# Patient Record
Sex: Female | Born: 1985 | Race: White | Hispanic: Yes | Marital: Married | Smoking: Never smoker
Health system: Southern US, Community
[De-identification: ages and names within clinical notes are randomized; demographics above are authoritative.]

## PROBLEM LIST (undated history)

## (undated) ENCOUNTER — Inpatient Hospital Stay (HOSPITAL_COMMUNITY): Payer: Self-pay

## (undated) DIAGNOSIS — Z789 Other specified health status: Secondary | ICD-10-CM

## (undated) DIAGNOSIS — T7840XA Allergy, unspecified, initial encounter: Secondary | ICD-10-CM

## (undated) HISTORY — DX: Allergy, unspecified, initial encounter: T78.40XA

---
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
## 2009-01-07 ENCOUNTER — Inpatient Hospital Stay (HOSPITAL_COMMUNITY): Admission: AD | Admit: 2009-01-07 | Discharge: 2009-01-09 | Payer: Self-pay | Admitting: Obstetrics and Gynecology

## 2009-02-17 ENCOUNTER — Emergency Department (HOSPITAL_COMMUNITY): Admission: EM | Admit: 2009-02-17 | Discharge: 2009-02-17 | Payer: Self-pay | Admitting: Emergency Medicine

## 2010-04-17 LAB — RPR: RPR Ser Ql: NONREACTIVE

## 2010-04-17 LAB — CBC
HCT: 39.6 % (ref 36.0–46.0)
MCHC: 33.8 g/dL (ref 30.0–36.0)
MCHC: 34 g/dL (ref 30.0–36.0)
MCV: 99.2 fL (ref 78.0–100.0)
Platelets: 281 10*3/uL (ref 150–400)
Platelets: 294 10*3/uL (ref 150–400)
RDW: 12.8 % (ref 11.5–15.5)
RDW: 12.9 % (ref 11.5–15.5)
WBC: 10.7 10*3/uL — ABNORMAL HIGH (ref 4.0–10.5)

## 2011-08-22 ENCOUNTER — Ambulatory Visit (INDEPENDENT_AMBULATORY_CARE_PROVIDER_SITE_OTHER): Payer: PRIVATE HEALTH INSURANCE | Admitting: Family Medicine

## 2011-08-22 VITALS — BP 102/70 | HR 79 | Temp 98.8°F | Resp 16 | Ht 66.25 in | Wt 131.0 lb

## 2011-08-22 DIAGNOSIS — Z0000 Encounter for general adult medical examination without abnormal findings: Secondary | ICD-10-CM

## 2011-08-22 DIAGNOSIS — J309 Allergic rhinitis, unspecified: Secondary | ICD-10-CM

## 2011-08-22 DIAGNOSIS — IMO0001 Reserved for inherently not codable concepts without codable children: Secondary | ICD-10-CM

## 2011-08-22 DIAGNOSIS — Z01419 Encounter for gynecological examination (general) (routine) without abnormal findings: Secondary | ICD-10-CM

## 2011-08-22 DIAGNOSIS — Z309 Encounter for contraceptive management, unspecified: Secondary | ICD-10-CM

## 2011-08-22 LAB — POCT CBC
Granulocyte percent: 68.9 % (ref 37–80)
HCT, POC: 45.8 % (ref 37.7–47.9)
Hemoglobin: 14.5 g/dL (ref 12.2–16.2)
Lymph, poc: 1.9 (ref 0.6–3.4)
MCH, POC: 30.8 pg (ref 27–31.2)
MCHC: 31.7 g/dL — AB (ref 31.8–35.4)
MCV: 97.3 fL — AB (ref 80–97)
MID (cbc): 0.4 (ref 0–0.9)
MPV: 9.1 fL (ref 0–99.8)
POC Granulocyte: 5.2 (ref 2–6.9)
POC LYMPH PERCENT: 25.8 %L (ref 10–50)
POC MID %: 5.3 % (ref 0–12)
Platelet Count, POC: 314 10*3/uL (ref 142–424)
RBC: 4.71 M/uL (ref 4.04–5.48)
RDW, POC: 12.9 %
WBC: 7.5 10*3/uL (ref 4.6–10.2)

## 2011-08-22 MED ORDER — NORGESTIMATE-ETH ESTRADIOL 0.25-35 MG-MCG PO TABS: 1.0000 | ORAL_TABLET | Freq: Every day | ORAL | Status: DC

## 2011-08-22 MED ORDER — FLUTICASONE PROPIONATE 50 MCG/ACT NA SUSP: 2.0000 | Freq: Every day | NASAL | Status: DC

## 2011-08-22 NOTE — Progress Notes (Signed)
[facility]:  Office Visit  Chief Complaint:  Chief Complaint  Patient presents with  . Nasal Congestion    x 1 week  . Sinusitis  . Sore Throat  . pap smear  . Contraception    HPI: Jade Walters is a 26 y.o. female who complains of : 1. 1 week h/o of sinus sxs with congestion, denies fevers, chills. Had achey bones now gone. Did not try anything for it. In past she had a nasal spray and that helped. She has a h/o allergies. 2. Annual exam-would like pap. Last pap Dec 2010. Would still like one.  3. Breast bump- Patient states that she got this bump on areola during breastfeeding, picked at it and it never went down-Deneis change in size, color or darainage since she noticed it. Mother's sister had breast cancer in her 30s.   Past Medical History  Diagnosis Date  . Allergy    History reviewed. No pertinent past surgical history. History   Social History  . Marital Status: Single    Spouse Name: N/A    Number of Children: N/A  . Years of Education: N/A   Social History Main Topics  . Smoking status: Never Smoker   . Smokeless tobacco: None  . Alcohol Use: No  . Drug Use: No  . Sexually Active: None   Other Topics Concern  . None   Social History Narrative  . None   Family History  Problem Relation Age of Onset  . Hypertension Father   . Hyperlipidemia Father    No Known Allergies Prior to Admission medications   Not on File     ROS: The patient denies fevers, chills, night sweats, unintentional weight loss, chest pain, palpitations, wheezing, dyspnea on exertion, nausea, vomiting, abdominal pain, dysuria, hematuria, melena, numbness, weakness, or tingling.   All other systems have been reviewed and were otherwise negative with the exception of those mentioned in the HPI and as above.    PHYSICAL EXAM: Filed Vitals:   08/22/11 1659  BP: 102/70  Pulse: 79  Temp: 98.8 F (37.1 C)  Resp: 16   Filed Vitals:   08/22/11 1659    Height: 5' 6.25" (1.683 m)  Weight: 131 lb (59.421 kg)   Body mass index is 20.98 kg/(m^2).  General: Alert, no acute distress HEENT:  Normocephalic, atraumatic, oropharynx patent.  Cardiovascular:  Regular rate and rhythm, no rubs murmurs or gallops.  No Carotid bruits, radial pulse intact. No pedal edema.  Respiratory: Clear to auscultation bilaterally.  No wheezes, rales, or rhonchi.  No cyanosis, no use of accessory musculature GI: No organomegaly, abdomen is soft and non-tender, positive bowel sounds.  No masses. Skin: No rashes. Neurologic: Facial musculature symmetric. Psychiatric: Patient is appropriate throughout our interaction. Lymphatic: No cervical lymphadenopathy Musculoskeletal: Gait intact. Breast - normal except for ? 3 mm nontender papule, moevable.  Encapsulated fibrosis of montgomery mammary gland on upper lateral left areola  GU exam nl  LABS: Results for orders placed during the hospital encounter of 02/17/09  POCT RAPID STREP A      Component Value Range   Streptococcus, Group A Screen (Direct) NEGATIVE  NEGATIVE     EKG/XRAY:   Primary read interpreted by Dr. Conley Rolls at [facility]   ASSESSMENT/PLAN: Encounter Diagnoses  Name Primary?  . Physical exam, annual Yes  . Birth control   . Allergic rhinitis    Patient doing well.  CBC, CMP ,LDL pending Monitor nodular lesion on areola  left breast, if not better than will get US/Mammogram/reexamine. I think this is a mammary gland that healed and scarred. Warm compresses. Stop picking at it. F/u in 4 weeks if no improvement Flonase for allergic rhinitis Sprintec given for OCP     LE, THAO PHUONG, DO 08/22/2011 5:49 PM

## 2011-08-23 LAB — LDL CHOLESTEROL, DIRECT: Direct LDL: 130 mg/dL — ABNORMAL HIGH

## 2011-08-23 LAB — BASIC METABOLIC PANEL WITH GFR
Chloride: 102 meq/L (ref 96–112)
Creat: 0.64 mg/dL (ref 0.50–1.10)
Potassium: 4.1 meq/L (ref 3.5–5.3)

## 2011-08-23 LAB — BASIC METABOLIC PANEL
BUN: 18 mg/dL (ref 6–23)
CO2: 24 mEq/L (ref 19–32)
Calcium: 9.5 mg/dL (ref 8.4–10.5)
Glucose, Bld: 76 mg/dL (ref 70–99)
Sodium: 137 mEq/L (ref 135–145)

## 2011-08-24 LAB — PAP IG, CT-NG, RFX HPV ASCU
Chlamydia Probe Amp: NEGATIVE
GC Probe Amp: NEGATIVE

## 2011-08-29 ENCOUNTER — Telehealth: Payer: Self-pay | Admitting: Family Medicine

## 2011-08-29 NOTE — Telephone Encounter (Signed)
Attempted to LM but voicemail not activated

## 2011-09-18 ENCOUNTER — Encounter: Payer: Self-pay | Admitting: Family Medicine

## 2014-03-18 ENCOUNTER — Other Ambulatory Visit: Payer: Self-pay | Admitting: Nurse Practitioner

## 2014-03-18 ENCOUNTER — Other Ambulatory Visit (HOSPITAL_COMMUNITY)
Admission: RE | Admit: 2014-03-18 | Discharge: 2014-03-18 | Disposition: A | Discharge status: Home / Self Care | Payer: 59 | Source: Ambulatory Visit | Attending: Obstetrics and Gynecology | Admitting: Obstetrics and Gynecology

## 2014-03-18 DIAGNOSIS — Z01419 Encounter for gynecological examination (general) (routine) without abnormal findings: Secondary | ICD-10-CM | POA: Diagnosis present

## 2014-03-19 LAB — CYTOLOGY - PAP

## 2016-07-26 ENCOUNTER — Encounter (HOSPITAL_COMMUNITY): Payer: Self-pay | Admitting: Emergency Medicine

## 2016-07-26 ENCOUNTER — Emergency Department (HOSPITAL_COMMUNITY): Payer: 59

## 2016-07-26 ENCOUNTER — Emergency Department (HOSPITAL_COMMUNITY)
Admission: EM | Admit: 2016-07-26 | Discharge: 2016-07-26 | Disposition: A | Discharge status: Home / Self Care | Payer: 59 | Attending: Emergency Medicine | Admitting: Emergency Medicine

## 2016-07-26 DIAGNOSIS — F419 Anxiety disorder, unspecified: Secondary | ICD-10-CM | POA: Diagnosis not present

## 2016-07-26 DIAGNOSIS — Z79899 Other long term (current) drug therapy: Secondary | ICD-10-CM | POA: Diagnosis not present

## 2016-07-26 DIAGNOSIS — R202 Paresthesia of skin: Secondary | ICD-10-CM | POA: Insufficient documentation

## 2016-07-26 DIAGNOSIS — R200 Anesthesia of skin: Secondary | ICD-10-CM | POA: Diagnosis not present

## 2016-07-26 LAB — COMPREHENSIVE METABOLIC PANEL
ALBUMIN: 4.5 g/dL (ref 3.5–5.0)
ALT: 16 U/L (ref 14–54)
ANION GAP: 10 (ref 5–15)
AST: 21 U/L (ref 15–41)
Alkaline Phosphatase: 51 U/L (ref 38–126)
BILIRUBIN TOTAL: 0.6 mg/dL (ref 0.3–1.2)
BUN: 16 mg/dL (ref 6–20)
CO2: 25 mmol/L (ref 22–32)
Calcium: 9.2 mg/dL (ref 8.9–10.3)
Chloride: 105 mmol/L (ref 101–111)
Creatinine, Ser: 0.71 mg/dL (ref 0.44–1.00)
GFR calc Af Amer: >60 mL/min (ref >60)
GLUCOSE: 109 mg/dL — AB (ref 65–99)
POTASSIUM: 3.4 mmol/L — AB (ref 3.5–5.1)
Sodium: 140 mmol/L (ref 135–145)
TOTAL PROTEIN: 7.3 g/dL (ref 6.5–8.1)

## 2016-07-26 LAB — I-STAT CHEM 8, ED
BUN: 18 mg/dL (ref 6–20)
Calcium, Ion: 1.17 mmol/L (ref 1.15–1.40)
Chloride: 101 mmol/L (ref 101–111)
Creatinine, Ser: 0.7 mg/dL (ref 0.44–1.00)
Glucose, Bld: 108 mg/dL — ABNORMAL HIGH (ref 65–99)
HEMATOCRIT: 40 % (ref 36.0–46.0)
HEMOGLOBIN: 13.6 g/dL (ref 12.0–15.0)
Potassium: 3.5 mmol/L (ref 3.5–5.1)
SODIUM: 141 mmol/L (ref 135–145)
TCO2: 28 mmol/L (ref 0–100)

## 2016-07-26 LAB — DIFFERENTIAL
Basophils Absolute: 0 10*3/uL (ref 0.0–0.1)
Basophils Relative: 0 %
EOS ABS: 0 10*3/uL (ref 0.0–0.7)
Eosinophils Relative: 0 %
LYMPHS ABS: 1.5 10*3/uL (ref 0.7–4.0)
Lymphocytes Relative: 22 %
Monocytes Absolute: 0.4 10*3/uL (ref 0.1–1.0)
Monocytes Relative: 6 %
NEUTROS PCT: 72 %
Neutro Abs: 5 10*3/uL (ref 1.7–7.7)

## 2016-07-26 LAB — CBC
HCT: 39.4 % (ref 36.0–46.0)
HEMOGLOBIN: 13.8 g/dL (ref 12.0–15.0)
MCH: 32.3 pg (ref 26.0–34.0)
MCHC: 35 g/dL (ref 30.0–36.0)
MCV: 92.3 fL (ref 78.0–100.0)
Platelets: 270 10*3/uL (ref 150–400)
RBC: 4.27 MIL/uL (ref 3.87–5.11)
RDW: 12.3 % (ref 11.5–15.5)
WBC: 6.9 10*3/uL (ref 4.0–10.5)

## 2016-07-26 LAB — I-STAT TROPONIN, ED: TROPONIN I, POC: 0.01 ng/mL (ref 0.00–0.08)

## 2016-07-26 LAB — PROTIME-INR
INR: 1.04
Prothrombin Time: 13.7 seconds (ref 11.4–15.2)

## 2016-07-26 LAB — APTT: aPTT: 36 seconds (ref 24–36)

## 2016-07-26 NOTE — ED Provider Notes (Signed)
[facility]-EMERGENCY DEPT Provider Note   CSN: [id number] Arrival date & time: 07/26/16  1900     History   Chief Complaint Chief Complaint  Patient presents with  . Facial Numbness    HPI Jade Walters is a 31 y.o. female with no significant history coming in today with facial numbness. Patient states she was looking through some bills after she had just picked up her son when she began experiencing some perioral numbness and tingling. She denied any nausea, vomiting, chest pain, shortness of breath during this time. She said she felt very stressed at the time. Has not had any previous episodes that are similar. Patient states it is now resolved and she has had no other neurological symptoms such as altered mental status, slurred speech, facial droop.  HPI  Past Medical History:  Diagnosis Date  . Allergy     There are no active problems to display for this patient.   History reviewed. No pertinent surgical history.  OB History    No data available       Home Medications    Prior to Admission medications   Medication Sig Start Date End Date Taking? Authorizing Provider  fluticasone (FLONASE) 50 MCG/ACT nasal spray Place 2 sprays into the nose daily. 08/22/11 08/21/12  Le, Thao P, DO  norgestimate-ethinyl estradiol (ORTHO-CYCLEN,SPRINTEC,PREVIFEM) 0.25-35 MG-MCG tablet Take 1 tablet by mouth daily. 08/22/11 08/21/12  Lenell Antu, DO    Family History Family History  Problem Relation Age of Onset  . Hypertension Father   . Hyperlipidemia Father     Social History Social History  Substance Use Topics  . Smoking status: Never Smoker  . Smokeless tobacco: Not on file  . Alcohol use No     Allergies   Patient has no known allergies.   Review of Systems Review of Systems  Constitutional: Negative for chills and fever.  HENT: Negative for ear pain and sore throat.   Eyes: Negative for pain and visual disturbance.  Respiratory: Negative for cough and shortness of breath.    Cardiovascular: Negative for chest pain and palpitations.  Gastrointestinal: Negative for abdominal pain and vomiting.  Endocrine: Negative for polyuria.  Genitourinary: Negative for dysuria and hematuria.  Musculoskeletal: Negative for arthralgias and back pain.  Skin: Negative for color change and rash.  Allergic/Immunologic: Negative for immunocompromised state.  Neurological: Positive for numbness. Negative for dizziness, tremors, seizures, syncope, facial asymmetry, speech difficulty, light-headedness and headaches.  Psychiatric/Behavioral: Negative for behavioral problems.  All other systems reviewed and are negative.    Physical Exam Updated Vital Signs BP 113/68 (BP Location: Right Arm)   Pulse 66   Resp 16   Ht 5\' 5"  (1.651 m)   Wt 63.5 kg (140 lb)   LMP 07/26/2016   SpO2 99%   BMI 23.30 kg/m   Physical Exam  Constitutional: She is oriented to person, place, and time. She appears well-developed and well-nourished. No distress.  HENT:  Head: Normocephalic and atraumatic.  Mouth/Throat: Oropharynx is clear and moist. No oropharyngeal exudate.  Eyes: Pupils are equal, round, and reactive to light. Conjunctivae and EOM are normal. No scleral icterus.  Neck: Normal range of motion. Neck supple. No tracheal deviation present.  Cardiovascular: Normal rate, regular rhythm and intact distal pulses.   No murmur heard. Pulmonary/Chest: Effort normal and breath sounds normal. No respiratory distress.  Abdominal: Soft. There is no tenderness. There is no rebound and no guarding.  Musculoskeletal: She exhibits no edema.  Neurological: She is  alert and oriented to person, place, and time. No cranial nerve deficit or sensory deficit. She exhibits normal muscle tone. Coordination normal.  Normal finger-nose, able to ambulate, 5 out of 5 strength throughout, normal sensation throughout.  Skin: Skin is warm and dry. She is not diaphoretic.  Psychiatric: She has a normal mood and  affect. Her behavior is normal.  Nursing note and vitals reviewed.    ED Treatments / Results  Labs (all labs ordered are listed, but only abnormal results are displayed) Labs Reviewed  COMPREHENSIVE METABOLIC PANEL - Abnormal; Notable for the following:       Result Value   Potassium 3.4 (*)    Glucose, Bld 109 (*)    All other components within normal limits  I-STAT CHEM 8, ED - Abnormal; Notable for the following:    Glucose, Bld 108 (*)    All other components within normal limits  PROTIME-INR  APTT  CBC  DIFFERENTIAL  I-STAT TROPOININ, ED    EKG  EKG Interpretation None       Radiology Ct Head Wo Contrast  Result Date: 07/26/2016 CLINICAL DATA:  Facial numbness. EXAM: CT HEAD WITHOUT CONTRAST TECHNIQUE: Contiguous axial images were obtained from the base of the skull through the vertex without intravenous contrast. COMPARISON:  None. FINDINGS: Brain: No evidence of acute infarction, hemorrhage, hydrocephalus, extra-axial collection or mass lesion/mass effect. Vascular: No hyperdense vessel or unexpected calcification. Skull: Normal. Negative for fracture or focal lesion. Sinuses/Orbits: There is debris posteriorly in the sphenoid sinuses. Paranasal sinuses, mastoid air cells, and middle ears are otherwise normal. Other: None. IMPRESSION: 1. Mild sinus disease. No acute intracranial abnormality. No cause for the patient's facial numbness identified. Electronically Signed   By: Gerome Samavid  Williams III M.D   On: 07/26/2016 19:49    Procedures Procedures (including critical care time)  Medications Ordered in ED Medications - No data to display   Initial Impression / Assessment and Plan / ED Course  I have reviewed the triage vital signs and the nursing notes.  Pertinent labs & imaging results that were available during my care of the patient were reviewed by me and considered in my medical decision making (see chart for details).     Patient presenting with facial  numbness since around 5:30 this evening. CT head negative. Considering patient did not have any chest pain, nausea, vomiting, diaphoresis during this time, I do not believe this is ACS. Also doubt stroke as her CT head was negative and also do not believe this is TIA, exam and history most consistent with symptoms of stress/anxiety. Patient counseled on stress relieving techniques and was discharged in stable condition and given strict return precautions. She voiced understanding and agreement and through shared decision making she was comfortable with outpatient management.  Patient was seen with my attending, Dr. Clarene DukeLittle, who voiced agreement and oversaw the evaluation and treatment of this patient.   Dr[employer] illustratorvoice dictation software was used in the creation of this note. If there are any errors or inconsistencies needing clarification, please contact me directly.   Final Clinical Impressions(s) / ED Diagnoses   Final diagnoses:  Anxiety    New Prescriptions New Prescriptions   No medications on file     Orson Slickolson, Markeda Narvaez, MD 07/26/16 2223    Clarene DukeLittle, Ambrose Finlandachel Morgan, MD 07/31/16 [phone]

## 2016-07-26 NOTE — ED Notes (Signed)
Dr. Rosalia Hammersay made aware of S/S

## 2016-07-26 NOTE — ED Triage Notes (Signed)
Pt reports onset of facial numbness appprox 1745. Numbness was only around bottom of face near lips, both sides. No other neuro deficits, no pain.

## 2017-01-15 NOTE — L&D Delivery Note (Signed)
Delivery Note At 8:53 AM a viable female was delivered via  (Presentation:vtx. OA ;  ).  APGAR:9-9 , ; weight  .   Placenta statusSPONT, INTACT: , .  Cord:  with the following complications: .  Cord pH: NOT SENT  Anesthesia: EPID  Episiotomy:  NONE Lacerations:  NONE Suture Repair: na Est. Blood Loss (mL):  200  Mom to postpartum.  Baby to Couplet care / Skin to Skin.  Jade Folk M Dariana Walters 12/19/2017, 9:00 AM

## 2017-03-13 DIAGNOSIS — N979 Female infertility, unspecified: Secondary | ICD-10-CM | POA: Diagnosis not present

## 2017-03-13 DIAGNOSIS — Z6821 Body mass index (BMI) 21.0-21.9, adult: Secondary | ICD-10-CM | POA: Diagnosis not present

## 2017-03-13 DIAGNOSIS — Z01419 Encounter for gynecological examination (general) (routine) without abnormal findings: Secondary | ICD-10-CM | POA: Diagnosis not present

## 2017-04-01 ENCOUNTER — Other Ambulatory Visit (HOSPITAL_COMMUNITY): Payer: Self-pay | Admitting: Obstetrics & Gynecology

## 2017-04-01 DIAGNOSIS — Z3141 Encounter for fertility testing: Secondary | ICD-10-CM

## 2017-04-04 ENCOUNTER — Encounter (HOSPITAL_COMMUNITY): Payer: Self-pay | Admitting: Radiology

## 2017-04-04 ENCOUNTER — Ambulatory Visit (HOSPITAL_COMMUNITY)
Admission: RE | Admit: 2017-04-04 | Discharge: 2017-04-04 | Disposition: A | Discharge status: Home / Self Care | Payer: 59 | Source: Ambulatory Visit | Attending: Obstetrics & Gynecology | Admitting: Obstetrics & Gynecology

## 2017-04-04 DIAGNOSIS — Z3141 Encounter for fertility testing: Secondary | ICD-10-CM | POA: Diagnosis not present

## 2017-04-04 DIAGNOSIS — N979 Female infertility, unspecified: Secondary | ICD-10-CM | POA: Diagnosis not present

## 2017-04-04 MED ORDER — IOPAMIDOL (ISOVUE-300) INJECTION 61%
30.0000 mL | Freq: Once | INTRAVENOUS | Status: AC
  Administered 2017-04-04: 9 mL

## 2017-05-23 DIAGNOSIS — N911 Secondary amenorrhea: Secondary | ICD-10-CM | POA: Diagnosis not present

## 2017-06-11 DIAGNOSIS — Z3481 Encounter for supervision of other normal pregnancy, first trimester: Secondary | ICD-10-CM | POA: Diagnosis not present

## 2017-06-11 DIAGNOSIS — Z3685 Encounter for antenatal screening for Streptococcus B: Secondary | ICD-10-CM | POA: Diagnosis not present

## 2017-06-11 LAB — OB RESULTS CONSOLE HEPATITIS B SURFACE ANTIGEN: Hepatitis B Surface Ag: NEGATIVE

## 2017-06-11 LAB — OB RESULTS CONSOLE GC/CHLAMYDIA
Chlamydia: NEGATIVE
Gonorrhea: NEGATIVE

## 2017-06-11 LAB — OB RESULTS CONSOLE RPR: RPR: NONREACTIVE

## 2017-06-11 LAB — OB RESULTS CONSOLE HIV ANTIBODY (ROUTINE TESTING): HIV: NONREACTIVE

## 2017-06-11 LAB — OB RESULTS CONSOLE RUBELLA ANTIBODY, IGM: Rubella: IMMUNE

## 2017-06-26 DIAGNOSIS — Z113 Encounter for screening for infections with a predominantly sexual mode of transmission: Secondary | ICD-10-CM | POA: Diagnosis not present

## 2017-06-26 DIAGNOSIS — Z3400 Encounter for supervision of normal first pregnancy, unspecified trimester: Secondary | ICD-10-CM | POA: Diagnosis not present

## 2017-08-05 DIAGNOSIS — Z363 Encounter for antenatal screening for malformations: Secondary | ICD-10-CM | POA: Diagnosis not present

## 2017-08-05 DIAGNOSIS — Z3400 Encounter for supervision of normal first pregnancy, unspecified trimester: Secondary | ICD-10-CM | POA: Diagnosis not present

## 2017-08-05 DIAGNOSIS — O283 Abnormal ultrasonic finding on antenatal screening of mother: Secondary | ICD-10-CM | POA: Diagnosis not present

## 2017-08-05 DIAGNOSIS — Z3A18 18 weeks gestation of pregnancy: Secondary | ICD-10-CM | POA: Diagnosis not present

## 2017-10-01 DIAGNOSIS — Z3400 Encounter for supervision of normal first pregnancy, unspecified trimester: Secondary | ICD-10-CM | POA: Diagnosis not present

## 2017-10-01 DIAGNOSIS — Z3480 Encounter for supervision of other normal pregnancy, unspecified trimester: Secondary | ICD-10-CM | POA: Diagnosis not present

## 2017-10-01 DIAGNOSIS — O9989 Other specified diseases and conditions complicating pregnancy, childbirth and the puerperium: Secondary | ICD-10-CM | POA: Diagnosis not present

## 2017-10-01 DIAGNOSIS — Z3A26 26 weeks gestation of pregnancy: Secondary | ICD-10-CM | POA: Diagnosis not present

## 2017-12-02 ENCOUNTER — Encounter (HOSPITAL_COMMUNITY): Payer: Self-pay

## 2017-12-02 ENCOUNTER — Inpatient Hospital Stay (HOSPITAL_COMMUNITY)
Admission: AD | Admit: 2017-12-02 | Discharge: 2017-12-03 | Disposition: A | Discharge status: Home / Self Care | Payer: 59 | Source: Ambulatory Visit | Attending: Obstetrics and Gynecology | Admitting: Obstetrics and Gynecology

## 2017-12-02 DIAGNOSIS — O4700 False labor before 37 completed weeks of gestation, unspecified trimester: Secondary | ICD-10-CM

## 2017-12-02 DIAGNOSIS — R102 Pelvic and perineal pain: Secondary | ICD-10-CM | POA: Diagnosis present

## 2017-12-02 DIAGNOSIS — Z3400 Encounter for supervision of normal first pregnancy, unspecified trimester: Secondary | ICD-10-CM | POA: Diagnosis not present

## 2017-12-02 DIAGNOSIS — Z3A35 35 weeks gestation of pregnancy: Secondary | ICD-10-CM | POA: Diagnosis not present

## 2017-12-02 DIAGNOSIS — Z23 Encounter for immunization: Secondary | ICD-10-CM | POA: Diagnosis not present

## 2017-12-02 DIAGNOSIS — O479 False labor, unspecified: Secondary | ICD-10-CM | POA: Diagnosis not present

## 2017-12-02 DIAGNOSIS — Z3480 Encounter for supervision of other normal pregnancy, unspecified trimester: Secondary | ICD-10-CM | POA: Diagnosis not present

## 2017-12-02 DIAGNOSIS — Z3689 Encounter for other specified antenatal screening: Secondary | ICD-10-CM

## 2017-12-02 NOTE — MAU Note (Signed)
Pt presents to MAU c/o abdominal pain/vaginal pressure. Pt reports occasional contractions throughout today. No bleeding or LOF. +FM. Pt reports she was 4cm in the office today.

## 2017-12-03 ENCOUNTER — Other Ambulatory Visit: Payer: Self-pay

## 2017-12-03 ENCOUNTER — Encounter (HOSPITAL_COMMUNITY): Payer: Self-pay | Admitting: *Deleted

## 2017-12-03 DIAGNOSIS — Z3A35 35 weeks gestation of pregnancy: Secondary | ICD-10-CM | POA: Diagnosis not present

## 2017-12-03 DIAGNOSIS — O479 False labor, unspecified: Secondary | ICD-10-CM | POA: Diagnosis not present

## 2017-12-03 DIAGNOSIS — Z3689 Encounter for other specified antenatal screening: Secondary | ICD-10-CM

## 2017-12-03 LAB — URINALYSIS, ROUTINE W REFLEX MICROSCOPIC
Bilirubin Urine: NEGATIVE
Glucose, UA: 50 mg/dL — AB
Hgb urine dipstick: NEGATIVE
KETONES UR: NEGATIVE mg/dL
Leukocytes, UA: NEGATIVE
NITRITE: NEGATIVE
PH: 6 (ref 5.0–8.0)
PROTEIN: NEGATIVE mg/dL
Specific Gravity, Urine: 1.014 (ref 1.005–1.030)

## 2017-12-03 NOTE — Discharge Instructions (Signed)
Vaginal Delivery Vaginal delivery means that you will give birth by pushing your baby out of your birth canal (vagina). A team of health care providers will help you before, during, and after vaginal delivery. Birth experiences are unique for every woman and every pregnancy, and birth experiences vary depending on where you choose to give birth. What should I do to prepare for my baby's birth? Before your baby is born, it is important to talk with your health care provider about:  Your labor and delivery preferences. These may include: ? Medicines that you may be given. ? How you will manage your pain. This might include non-medical pain relief techniques or injectable pain relief such as epidural analgesia. ? How you and your baby will be monitored during labor and delivery. ? Who may be in the labor and delivery room with you. ? Your feelings about surgical delivery of your baby (cesarean delivery, or C-section) if this becomes necessary. ? Your feelings about receiving donated blood through an IV tube (blood transfusion) if this becomes necessary.  Whether you are able: ? To take pictures or videos of the birth. ? To eat during labor and delivery. ? To move around, walk, or change positions during labor and delivery.  What to expect after your baby is born, such as: ? Whether delayed umbilical cord clamping and cutting is offered. ? Who will care for your baby right after birth. ? Medicines or tests that may be recommended for your baby. ? Whether breastfeeding is supported in your hospital or birth center. ? How long you will be in the hospital or birth center.  How any medical conditions you have may affect your baby or your labor and delivery experience.  To prepare for your baby's birth, you should also:  Attend all of your health care visits before delivery (prenatal visits) as recommended by your health care provider. This is important.  Prepare your home for your baby's  arrival. Make sure that you have: ? Diapers. ? Baby clothing. ? Feeding equipment. ? Safe sleeping arrangements for you and your baby.  Install a car seat in your vehicle. Have your car seat checked by a certified car seat installer to make sure that it is installed safely.  Think about who will help you with your new baby at home for at least the first several weeks after delivery.  What can I expect when I arrive at the birth center or hospital? Once you are in labor and have been admitted into the hospital or birth center, your health care provider may:  Review your pregnancy history and any concerns you have.  Insert an IV tube into one of your veins. This is used to give you fluids and medicines.  Check your blood pressure, pulse, temperature, and heart rate (vital signs).  Check whether your bag of water (amniotic sac) has broken (ruptured).  Talk with you about your birth plan and discuss pain control options.  Monitoring Your health care provider may monitor your contractions (uterine monitoring) and your baby's heart rate (fetal monitoring). You may need to be monitored:  Often, but not continuously (intermittently).  All the time or for long periods at a time (continuously). Continuous monitoring may be needed if: ? You are taking certain medicines, such as medicine to relieve pain or make your contractions stronger. ? You have pregnancy or labor complications.  Monitoring may be done by:  Placing a special stethoscope or a handheld monitoring device on your abdomen to   check your baby's heartbeat, and feeling your abdomen for contractions. This method of monitoring does not continuously record your baby's heartbeat or your contractions.  Placing monitors on your abdomen (external monitors) to record your baby's heartbeat and the frequency and length of contractions. You may not have to wear external monitors all the time.  Placing monitors inside of your uterus  (internal monitors) to record your baby's heartbeat and the frequency, length, and strength of your contractions. ? Your health care provider may use internal monitors if he or she needs more information about the strength of your contractions or your baby's heart rate. ? Internal monitors are put in place by passing a thin, flexible wire through your vagina and into your uterus. Depending on the type of monitor, it may remain in your uterus or on your baby's head until birth. ? Your health care provider will discuss the benefits and risks of internal monitoring with you and will ask for your permission before inserting the monitors.  Telemetry. This is a type of continuous monitoring that can be done with external or internal monitors. Instead of having to stay in bed, you are able to move around during telemetry. Ask your health care provider if telemetry is an option for you.  Physical exam Your health care provider may perform a physical exam. This may include:  Checking whether your baby is positioned: ? With the head toward your vagina (head-down). This is most common. ? With the head toward the top of your uterus (head-up or breech). If your baby is in a breech position, your health care provider may try to turn your baby to a head-down position so you can deliver vaginally. If it does not seem that your baby can be born vaginally, your provider may recommend surgery to deliver your baby. In rare cases, you may be able to deliver vaginally if your baby is head-up (breech delivery). ? Lying sideways (transverse). Babies that are lying sideways cannot be delivered vaginally.  Checking your cervix to determine: ? Whether it is thinning out (effacing). ? Whether it is opening up (dilating). ? How low your baby has moved into your birth canal.  What are the three stages of labor and delivery?  Normal labor and delivery is divided into the following three stages: Stage 1  Stage 1 is the  longest stage of labor, and it can last for hours or days. Stage 1 includes: ? Early labor. This is when contractions may be irregular, or regular and mild. Generally, early labor contractions are more than 10 minutes apart. ? Active labor. This is when contractions get longer, more regular, more frequent, and more intense. ? The transition phase. This is when contractions happen very close together, are very intense, and may last longer than during any other part of labor.  Contractions generally feel mild, infrequent, and irregular at first. They get stronger, more frequent (about every 2-3 minutes), and more regular as you progress from early labor through active labor and transition.  Many women progress through stage 1 naturally, but you may need help to continue making progress. If this happens, your health care provider may talk with you about: ? Rupturing your amniotic sac if it has not ruptured yet. ? Giving you medicine to help make your contractions stronger and more frequent.  Stage 1 ends when your cervix is completely dilated to 4 inches (10 cm) and completely effaced. This happens at the end of the transition phase. Stage 2  Once   your cervix is completely effaced and dilated to 4 inches (10 cm), you may start to feel an urge to push. It is common for the body to naturally take a rest before feeling the urge to push, especially if you received an epidural or certain other pain medicines. This rest period may last for up to 1-2 hours, depending on your unique labor experience.  During stage 2, contractions are generally less painful, because pushing helps relieve contraction pain. Instead of contraction pain, you may feel stretching and burning pain, especially when the widest part of your baby's head passes through the vaginal opening (crowning).  Your health care provider will closely monitor your pushing progress and your baby's progress through the vagina during stage 2.  Your  health care provider may massage the area of skin between your vaginal opening and anus (perineum) or apply warm compresses to your perineum. This helps it stretch as the baby's head starts to crown, which can help prevent perineal tearing. ? In some cases, an incision may be made in your perineum (episiotomy) to allow the baby to pass through the vaginal opening. An episiotomy helps to make the opening of the vagina larger to allow more room for the baby to fit through.  It is very important to breathe and focus so your health care provider can control the delivery of your baby's head. Your health care provider may have you decrease the intensity of your pushing, to help prevent perineal tearing.  After delivery of your baby's head, the shoulders and the rest of the body generally deliver very quickly and without difficulty.  Once your baby is delivered, the umbilical cord may be cut right away, or this may be delayed for 1-2 minutes, depending on your baby's health. This may vary among health care providers, hospitals, and birth centers.  If you and your baby are healthy enough, your baby may be placed on your chest or abdomen to help maintain the baby's temperature and to help you bond with each other. Some mothers and babies start breastfeeding at this time. Your health care team will dry your baby and help keep your baby warm during this time.  Your baby may need immediate care if he or she: ? Showed signs of distress during labor. ? Has a medical condition. ? Was born too early (prematurely). ? Had a bowel movement before birth (meconium). ? Shows signs of difficulty transitioning from being inside the uterus to being outside of the uterus. If you are planning to breastfeed, your health care team will help you begin a feeding. Stage 3  The third stage of labor starts immediately after the birth of your baby and ends after you deliver the placenta. The placenta is an organ that develops  during pregnancy to provide oxygen and nutrients to your baby in the womb.  Delivering the placenta may require some pushing, and you may have mild contractions. Breastfeeding can stimulate contractions to help you deliver the placenta.  After the placenta is delivered, your uterus should tighten (contract) and become firm. This helps to stop bleeding in your uterus. To help your uterus contract and to control bleeding, your health care provider may: ? Give you medicine by injection, through an IV tube, by mouth, or through your rectum (rectally). ? Massage your abdomen or perform a vaginal exam to remove any blood clots that are left in your uterus. ? Empty your bladder by placing a thin, flexible tube (catheter) into your bladder. ? Encourage   you to breastfeed your baby. After labor is over, you and your baby will be monitored closely to ensure that you are both healthy until you are ready to go home. Your health care team will teach you how to care for yourself and your baby. This information is not intended to replace advice given to you by your health care provider. Make sure you discuss any questions you have with your health care provider. Document Released: 10/11/2007 Document Revised: 07/22/2015 Document Reviewed: 01/16/2015 [employer]vier Interactive Patient Education  2018 [employer]  

## 2017-12-03 NOTE — MAU Provider Note (Signed)
History     CSN: [id number]  Arrival date and time: 12/02/17 2341   First Provider Initiated Contact with Patient 12/03/17 0013      Chief Complaint  Patient presents with  . Contractions  . Pelvic Pain   HPI  Jade Walters is a 32 y.o. G2P1001 at 9724w4d who presents to MAU with chief complaint of pelvic "pressure and tightening". She endorses occasional contractions throughout the day today. Also states she was seen in clinic 11/18 and found to be 4cm dilated. Denies vaginal bleeding, leaking of fluid, decreased fetal movement, fever, falls, or recent illness.    OB History    Gravida  2   Para  1   Term  1   Preterm      AB      Living  1     SAB      TAB      Ectopic      Multiple      Live Births  1           Past Medical History:  Diagnosis Date  . Allergy     History reviewed. No pertinent surgical history.  Family History  Problem Relation Age of Onset  . Hypertension Father   . Hyperlipidemia Father   . Diabetes Maternal Grandmother     Social History   Tobacco Use  . Smoking status: Never Smoker  Substance Use Topics  . Alcohol use: No  . Drug use: No    Allergies: No Known Allergies  Medications Prior to Admission  Medication Sig Dispense Refill Last Dose  . Prenatal Vit-Fe Fumarate-FA (PRENATAL MULTIVITAMIN) TABS tablet Take 1 tablet by mouth daily at 12 noon.   12/02/2017 at Unknown time  . fluticasone (FLONASE) 50 MCG/ACT nasal spray Place 2 sprays into the nose daily. 16 g 6   . norgestimate-ethinyl estradiol (ORTHO-CYCLEN,SPRINTEC,PREVIFEM) 0.25-35 MG-MCG tablet Take 1 tablet by mouth daily. 1 Package 11     Review of Systems  Constitutional: Negative for chills and fever.  Gastrointestinal: Positive for abdominal pain.  Genitourinary: Negative for vaginal bleeding.  Musculoskeletal: Negative for back pain.  Neurological: Negative for headaches.  All other systems reviewed and are negative.  Physical Exam   Blood  pressure 108/72, pulse 94, temperature 98 F (36.7 C), temperature source Oral, resp. rate 17, height 5\' 5"  (1.651 m), weight 76.7 kg, last menstrual period 03/29/2017.  Physical Exam  Nursing note and vitals reviewed. Constitutional: She is oriented to person, place, and time. She appears well-developed and well-nourished.  Cardiovascular: Normal rate.  GI: Soft. She exhibits no distension. There is no tenderness. There is no rebound and no guarding.  Genitourinary: Vagina normal and uterus normal. No vaginal discharge found.  Musculoskeletal: Normal range of motion.  Neurological: She is alert and oriented to person, place, and time. She has normal reflexes.  Skin: Skin is warm and dry.  Psychiatric: She has a normal mood and affect. Her behavior is normal. Judgment and thought content normal.    MAU Course/MDM   --Reactive fetal tracing: baseline 135, moderate variability, positive accelerations, no decels --Toco:  Occasional contractions resolving with PO hydration, uterine irritability noted --Cervix unchanged from office, no change upon recheck after two hours of observation in MAU --Both cervical exams conducted by CNM  Patient Vitals for the past 24 hrs:  BP Temp Temp src Pulse Resp Height Weight  12/03/17 0216 108/72 98 F (36.7 C) Oral 94 17 - -  12/02/17 2359  120/79 98.3 F (36.8 C) Oral 98 18 5\' 5"  (1.651 m) 76.7 kg    Results for orders placed or performed during the hospital encounter of 12/02/17 (from the past 24 hour(s))  Urinalysis, Routine w reflex microscopic     Status: Abnormal   Collection Time: 12/02/17 11:59 PM  Result Value Ref Range   Color, Urine YELLOW YELLOW   APPearance CLEAR CLEAR   Specific Gravity, Urine 1.014 1.005 - 1.030   pH 6.0 5.0 - 8.0   Glucose, UA 50 (A) NEGATIVE mg/dL   Hgb urine dipstick NEGATIVE NEGATIVE   Bilirubin Urine NEGATIVE NEGATIVE   Ketones, ur NEGATIVE NEGATIVE mg/dL   Protein, ur NEGATIVE NEGATIVE mg/dL   Nitrite  NEGATIVE NEGATIVE   Leukocytes, UA NEGATIVE NEGATIVE    Assessment and Plan  --32 y.o. G2P1001 at [redacted]w[redacted]d  --Preterm contractions without cervical dilation --Reviewed general obstetric precautions including but not limited to falls, fever, vaginal bleeding, leaking of fluid, decreased fetal movement, headache not relieved by Tylenol, rest and PO hydration. --Discharge home in stable condition  Calvert Cantor, [place] 12/03/2017, 2:24 AM

## 2017-12-18 LAB — OB RESULTS CONSOLE GBS: GBS: NEGATIVE

## 2017-12-19 ENCOUNTER — Encounter (HOSPITAL_COMMUNITY): Payer: Self-pay | Admitting: *Deleted

## 2017-12-19 ENCOUNTER — Inpatient Hospital Stay (HOSPITAL_COMMUNITY): Payer: 59 | Admitting: Anesthesiology

## 2017-12-19 ENCOUNTER — Other Ambulatory Visit: Payer: Self-pay

## 2017-12-19 ENCOUNTER — Inpatient Hospital Stay (HOSPITAL_COMMUNITY)
Admission: AD | Admit: 2017-12-19 | Discharge: 2017-12-20 | DRG: 807 | Disposition: A | Discharge status: Home / Self Care | Payer: 59 | Attending: Obstetrics and Gynecology | Admitting: Obstetrics and Gynecology

## 2017-12-19 DIAGNOSIS — Z3A37 37 weeks gestation of pregnancy: Secondary | ICD-10-CM

## 2017-12-19 DIAGNOSIS — Z3483 Encounter for supervision of other normal pregnancy, third trimester: Secondary | ICD-10-CM | POA: Diagnosis present

## 2017-12-19 LAB — CBC
HCT: 35.6 % — ABNORMAL LOW (ref 36.0–46.0)
Hemoglobin: 12.1 g/dL (ref 12.0–15.0)
MCH: 33.4 pg (ref 26.0–34.0)
MCHC: 34 g/dL (ref 30.0–36.0)
MCV: 98.3 fL (ref 80.0–100.0)
Platelets: 279 10*3/uL (ref 150–400)
RBC: 3.62 MIL/uL — ABNORMAL LOW (ref 3.87–5.11)
RDW: 12.6 % (ref 11.5–15.5)
WBC: 8.9 10*3/uL (ref 4.0–10.5)
nRBC: 0 % (ref 0.0–0.2)

## 2017-12-19 LAB — TYPE AND SCREEN
ABO/RH(D): A POS
Antibody Screen: NEGATIVE

## 2017-12-19 LAB — RPR: RPR: NONREACTIVE

## 2017-12-19 LAB — ABO/RH: ABO/RH(D): A POS

## 2017-12-19 MED ORDER — SENNOSIDES-DOCUSATE SODIUM 8.6-50 MG PO TABS
2.0000 | ORAL_TABLET | ORAL | Status: DC
  Administered 2017-12-20: 2 via ORAL
  Filled 2017-12-19: qty 2

## 2017-12-19 MED ORDER — OXYCODONE-ACETAMINOPHEN 5-325 MG PO TABS: 1.0000 | ORAL_TABLET | ORAL | Status: DC | PRN

## 2017-12-19 MED ORDER — VITAMIN K1 1 MG/0.5ML IJ SOLN
INTRAMUSCULAR | Status: AC
  Filled 2017-12-19: qty 0.5

## 2017-12-19 MED ORDER — BISACODYL 10 MG RE SUPP: 10.0000 mg | Freq: Every day | RECTAL | Status: DC | PRN

## 2017-12-19 MED ORDER — MEASLES, MUMPS & RUBELLA VAC IJ SOLR: 0.5000 mL | Freq: Once | INTRAMUSCULAR | Status: DC

## 2017-12-19 MED ORDER — ERYTHROMYCIN 5 MG/GM OP OINT
TOPICAL_OINTMENT | OPHTHALMIC | Status: AC
  Filled 2017-12-19: qty 1

## 2017-12-19 MED ORDER — OXYCODONE-ACETAMINOPHEN 5-325 MG PO TABS: 2.0000 | ORAL_TABLET | ORAL | Status: DC | PRN

## 2017-12-19 MED ORDER — LACTATED RINGERS IV SOLN
500.0000 mL | Freq: Once | INTRAVENOUS | Status: AC
  Administered 2017-12-19: 500 mL via INTRAVENOUS

## 2017-12-19 MED ORDER — TERBUTALINE SULFATE 1 MG/ML IJ SOLN
0.2500 mg | Freq: Once | INTRAMUSCULAR | Status: DC | PRN
  Filled 2017-12-19: qty 1

## 2017-12-19 MED ORDER — ACETAMINOPHEN 325 MG PO TABS: 650.0000 mg | ORAL_TABLET | ORAL | Status: DC | PRN

## 2017-12-19 MED ORDER — LIDOCAINE HCL (PF) 1 % IJ SOLN
30.0000 mL | INTRAMUSCULAR | Status: DC | PRN
  Filled 2017-12-19: qty 30

## 2017-12-19 MED ORDER — WITCH HAZEL-GLYCERIN EX PADS: 1.0000 "application " | MEDICATED_PAD | CUTANEOUS | Status: DC | PRN

## 2017-12-19 MED ORDER — OXYTOCIN 40 UNITS IN LACTATED RINGERS INFUSION - SIMPLE MED: 2.5000 [IU]/h | INTRAVENOUS | Status: DC

## 2017-12-19 MED ORDER — ZOLPIDEM TARTRATE 5 MG PO TABS: 5.0000 mg | ORAL_TABLET | Freq: Every evening | ORAL | Status: DC | PRN

## 2017-12-19 MED ORDER — DIPHENHYDRAMINE HCL 25 MG PO CAPS: 25.0000 mg | ORAL_CAPSULE | Freq: Four times a day (QID) | ORAL | Status: DC | PRN

## 2017-12-19 MED ORDER — ONDANSETRON HCL 4 MG/2ML IJ SOLN: 4.0000 mg | INTRAMUSCULAR | Status: DC | PRN

## 2017-12-19 MED ORDER — OXYTOCIN 40 UNITS IN LACTATED RINGERS INFUSION - SIMPLE MED
1.0000 m[IU]/min | INTRAVENOUS | Status: DC
  Administered 2017-12-19: 2 m[IU]/min via INTRAVENOUS
  Filled 2017-12-19: qty 1000

## 2017-12-19 MED ORDER — SOD CITRATE-CITRIC ACID 500-334 MG/5ML PO SOLN: 30.0000 mL | ORAL | Status: DC | PRN

## 2017-12-19 MED ORDER — LIDOCAINE HCL (PF) 1 % IJ SOLN
INTRAMUSCULAR | Status: DC | PRN
  Administered 2017-12-19 (×2): 5 mL via EPIDURAL

## 2017-12-19 MED ORDER — ONDANSETRON HCL 4 MG PO TABS: 4.0000 mg | ORAL_TABLET | ORAL | Status: DC | PRN

## 2017-12-19 MED ORDER — EPHEDRINE 5 MG/ML INJ
10.0000 mg | INTRAVENOUS | Status: DC | PRN
  Filled 2017-12-19: qty 2

## 2017-12-19 MED ORDER — BENZOCAINE-MENTHOL 20-0.5 % EX AERO: 1.0000 "application " | INHALATION_SPRAY | CUTANEOUS | Status: DC | PRN

## 2017-12-19 MED ORDER — OXYTOCIN BOLUS FROM INFUSION
500.0000 mL | Freq: Once | INTRAVENOUS | Status: AC
  Administered 2017-12-19: 500 mL via INTRAVENOUS

## 2017-12-19 MED ORDER — DIPHENHYDRAMINE HCL 50 MG/ML IJ SOLN: 12.5000 mg | INTRAMUSCULAR | Status: DC | PRN

## 2017-12-19 MED ORDER — PHENYLEPHRINE 40 MCG/ML (10ML) SYRINGE FOR IV PUSH (FOR BLOOD PRESSURE SUPPORT)
80.0000 ug | PREFILLED_SYRINGE | INTRAVENOUS | Status: DC | PRN
  Filled 2017-12-19: qty 5
  Filled 2017-12-19: qty 10

## 2017-12-19 MED ORDER — ONDANSETRON HCL 4 MG/2ML IJ SOLN: 4.0000 mg | Freq: Four times a day (QID) | INTRAMUSCULAR | Status: DC | PRN

## 2017-12-19 MED ORDER — LACTATED RINGERS IV SOLN
INTRAVENOUS | Status: DC
  Administered 2017-12-19 (×2): via INTRAVENOUS

## 2017-12-19 MED ORDER — FENTANYL 2.5 MCG/ML BUPIVACAINE 1/10 % EPIDURAL INFUSION (WH - ANES)
14.0000 mL/h | INTRAMUSCULAR | Status: DC | PRN
  Administered 2017-12-19: 14 mL/h via EPIDURAL
  Filled 2017-12-19: qty 100

## 2017-12-19 MED ORDER — COCONUT OIL OIL: 1.0000 "application " | TOPICAL_OIL | Status: DC | PRN

## 2017-12-19 MED ORDER — IBUPROFEN 800 MG PO TABS
800.0000 mg | ORAL_TABLET | Freq: Three times a day (TID) | ORAL | Status: DC | PRN
  Administered 2017-12-19 – 2017-12-20 (×3): 800 mg via ORAL
  Filled 2017-12-19 (×3): qty 1

## 2017-12-19 MED ORDER — PHENYLEPHRINE 40 MCG/ML (10ML) SYRINGE FOR IV PUSH (FOR BLOOD PRESSURE SUPPORT)
80.0000 ug | PREFILLED_SYRINGE | INTRAVENOUS | Status: DC | PRN
  Filled 2017-12-19: qty 5

## 2017-12-19 MED ORDER — SIMETHICONE 80 MG PO CHEW: 80.0000 mg | CHEWABLE_TABLET | ORAL | Status: DC | PRN

## 2017-12-19 MED ORDER — PRENATAL MULTIVITAMIN CH
1.0000 | ORAL_TABLET | Freq: Every day | ORAL | Status: DC
  Administered 2017-12-20: 1 via ORAL
  Filled 2017-12-19: qty 1

## 2017-12-19 MED ORDER — FLEET ENEMA 7-19 GM/118ML RE ENEM: 1.0000 | ENEMA | RECTAL | Status: DC | PRN

## 2017-12-19 MED ORDER — DIBUCAINE 1 % RE OINT: 1.0000 "application " | TOPICAL_OINTMENT | RECTAL | Status: DC | PRN

## 2017-12-19 MED ORDER — FLEET ENEMA 7-19 GM/118ML RE ENEM: 1.0000 | ENEMA | Freq: Every day | RECTAL | Status: DC | PRN

## 2017-12-19 MED ORDER — LACTATED RINGERS IV SOLN: 500.0000 mL | INTRAVENOUS | Status: DC | PRN

## 2017-12-19 MED ORDER — TETANUS-DIPHTH-ACELL PERTUSSIS 5-2.5-18.5 LF-MCG/0.5 IM SUSP: 0.5000 mL | Freq: Once | INTRAMUSCULAR | Status: DC

## 2017-12-19 NOTE — MAU Note (Signed)
Pt reports to MAU c/o ctx every . +FM. No rupture of membranes however pt reports loosing mucous plug and having brown discharge.

## 2017-12-19 NOTE — Lactation Note (Signed)
This note was copied from a baby's chart. Lactation Consultation Note  Patient Name: Jade Walters Reason for consult: Initial assessment;Early term 37-38.6wks;Other (Comment)(experienced Breast feeder ) per mom x 1 year - ( now 258 1/30 years old )  Baby is 8 hours old  LC reviewed and updated the doc flow sheets, baby has voided/ HNS.  Baby awake after assessment/ LC offered to assist mom to latch and mom receptive  For assistance. LC placed baby STS on the right breast / football/ baby awake and opened  Wide and latched easily with depth/ multiple swallow noted and increased with breast compressions/ nipple well rounded when baby released/ per mom comfortable with feeding.  Latch score 9.  LC reviewed basics of breast feeding since it has been 8 1/2 years since she last breast fed. Importance of STS feedings until the baby is back to birth weight , gaining steadily, and can stay awake for feeding. Nutritive vs non- nutritive feeding patterns, and the importance of hanging out latched. Benefits of STS and hand expressing / colostrum containers provided/  And reviewed hand expressing.  Due to areola edema / especially on the left breast / LC instructed mom on the use shells between feedings, when not STS or sleeping to enhance compressibility of areola and  To prevent soreness.  Mom and dad receptive to review.  Per mom is a [employer], [employer] employee and will need her DEBP UMR benefits pump prior to D/C .  LC recommended going on-line Medela[employer] to check on styles.  Mother informed of post-discharge support and given phone number to the lactation department, including services for phone call assistance; out-patient appointments; and breastfeeding support group. List of other breastfeeding resources in the community given in the handout. Encouraged mother to call for problems or concerns related to breastfeeding.      Maternal Data Has patient been taught Hand Expression?:  Yes(several drops of colostrum ) Does the patient have breastfeeding experience prior to this delivery?: Yes  Feeding Feeding Type: Breast Fed  LATCH Score Latch: Grasps breast easily, tongue down, lips flanged, rhythmical sucking.  Audible Swallowing: Spontaneous and intermittent  Type of Nipple: Everted at rest and after stimulation  Comfort (Breast/Nipple): Soft / non-tender  Hold (Positioning): Assistance needed to correctly position infant at breast and maintain latch.  LATCH Score: 9  Interventions Interventions: Breast feeding basics reviewed;Assisted with latch;Skin to skin;Breast massage;Hand express;Reverse pressure;Breast compression;Adjust position;Support pillows;Position options;Shells  Lactation Tools Discussed/Used Tools: Shells(LC instructed on the use of shells between feedings except  when sleeping . due to areola edema ) Shell Type: Inverted WIC Program: No(per mom )   Consult Status Consult Status: Follow-up Date: 12/20/17 Follow-up type: In-patient    Jade Walters Walters, 4:56 PM

## 2017-12-19 NOTE — Progress Notes (Signed)
Pt fainted on steady at bedside of MBU. 2 labor and delivery nurses with pt. Emergency call bell pulled. Pt revived with ammonia. Pt assised to bed and vital signs taken. Pt instructed not to get out of bed unassisted.

## 2017-12-19 NOTE — Anesthesia Procedure Notes (Signed)
Epidural Patient location during procedure: OB Start time: 12/19/2017 6:17 AM End time: 12/19/2017 6:31 AM  Staffing Anesthesiologist: Lucretia KernWitman, Brooklynn Brandenburg E, MD Performed: anesthesiologist   Preanesthetic Checklist Completed: patient identified, pre-op evaluation, timeout performed, IV checked, risks and benefits discussed and monitors and equipment checked  Epidural Patient position: sitting Prep: DuraPrep Patient monitoring: heart rate, continuous pulse ox and blood pressure Approach: midline Location: L2-L3 Injection technique: LOR air  Needle:  Needle type: Tuohy  Needle gauge: 17 G Needle length: 9 cm Needle insertion depth: 4.5 cm Catheter type: closed end flexible Catheter size: 19 Gauge Catheter at skin depth: 9.5 cm  Assessment Events: blood not aspirated, injection not painful, no injection resistance, negative IV test and no paresthesia  Additional Notes Reason for block:procedure for pain

## 2017-12-19 NOTE — Anesthesia Preprocedure Evaluation (Signed)

## 2017-12-19 NOTE — Anesthesia Pain Management Evaluation Note (Signed)
  CRNA Pain Management Visit Note  Patient: Jade Walters, 32 y.o., female  "Hello I am a member of the anesthesia team at [facility]. We have an anesthesia team available at all times to provide care throughout the hospital, including epidural management and anesthesia for C-section. I don't know your plan for the delivery whether it a natural birth, water birth, IV sedation, nitrous supplementation, doula or epidural, but we want to meet your pain goals."   1.Was your pain managed to your expectations on prior hospitalizations?   Yes   2.What is your expectation for pain management during this hospitalization?     Epidural  3.How can we help you reach that goal? Maintain epidural until delivery of infant.  Record the patient's initial score and the patient's pain goal.   Pain: 4,PCA bolus given and Pain reduced to 0.  Pain Goal: 1 [facility] wants you to be able to say your pain was always managed very well.  Aariel Ems 12/19/2017

## 2017-12-19 NOTE — Progress Notes (Signed)
Shortly after AROM, deceleration to 60s noted  Exam No cord palpated  FSE and IUPC Placed FHR back to baseline after position change   Epidural to be ordered

## 2017-12-19 NOTE — Anesthesia Postprocedure Evaluation (Signed)
Anesthesia Post Note  Patient: [name]  Procedure(s) Performed: AN AD HOC LABOR EPIDURAL     Patient location during evaluation: Mother Baby Anesthesia Type: Epidural Level of consciousness: awake Pain management: pain level controlled Vital Signs Assessment: post-procedure vital signs reviewed and stable Respiratory status: spontaneous breathing Cardiovascular status: stable Postop Assessment: patient able to bend at knees, epidural receding, no backache and no headache Anesthetic complications: no    Last Vitals:  Vitals:   12/19/17 1100 12/19/17 1200  BP: 112/69 101/61  Pulse: 60 83  Resp: 18 18  Temp: 36.7 C 36.6 C  SpO2: 98%     Last Pain:  Vitals:   12/19/17 1200  TempSrc: Oral  PainSc: 0-No pain   Pain Goal:                 Edison PaceWILKERSON,Rocko Fesperman

## 2017-12-19 NOTE — H&P (Signed)
Jade Walters is a 32 year old G 2 P 1 at 4937 w 6 days presents in active labor.  OB History    Gravida  2   Para  1   Term  1   Preterm      AB      Living  1     SAB      TAB      Ectopic      Multiple      Live Births  1          Past Medical History:  Diagnosis Date  . Allergy    History reviewed. No pertinent surgical history. Family History: family history includes Diabetes in her maternal grandmother; Hyperlipidemia in her father; Hypertension in her father. Social History:  reports that she has never smoked. She has never used smokeless tobacco. She reports that she does not drink alcohol or use drugs.     Maternal Diabetes: No Genetic Screening: Normal Maternal Ultrasounds/Referrals: Normal Fetal Ultrasounds or other Referrals:  None Maternal Substance Abuse:  No Significant Maternal Medications:  None Significant Maternal Lab Results:  None Other Comments:  None  Review of Systems  All other systems reviewed and are negative.  Maternal Medical History:  Reason for admission: Contractions.     Dilation: 5.5 Effacement (%): 80 Station: -2 Exam by:: Dr. Vincente PoliGrewal  Blood pressure 108/63, pulse 77, temperature 97.8 F (36.6 C), temperature source Oral, resp. rate 16, weight 78.1 kg, last menstrual period 03/29/2017. Maternal Exam:  Uterine Assessment: Contraction strength is moderate.  Contraction frequency is regular.   Abdomen: Fetal presentation: vertex     Fetal Exam Fetal State Assessment: Category I - tracings are normal.     Physical Exam  Nursing note and vitals reviewed. Constitutional: She appears well-developed and well-nourished.  HENT:  Head: Normocephalic.  Eyes: Pupils are equal, round, and reactive to light.  Neck: Normal range of motion.  Cardiovascular: Normal rate and regular rhythm.  Respiratory: Effort normal.    Prenatal labs: ABO, Rh: --/--/A POS (12/05 0255) Antibody: NEG (12/05 0255) Rubella:   RPR:     HBsAg:    HIV:    GBS:     Assessment/Plan: IUP at 37 w 6 days Labor epidural   Jeani HawkingMichelle L Jaqwan Wieber 12/19/2017, 6:03 AM

## 2017-12-20 LAB — CBC
HCT: 34.4 % — ABNORMAL LOW (ref 36.0–46.0)
Hemoglobin: 11.6 g/dL — ABNORMAL LOW (ref 12.0–15.0)
MCH: 33.4 pg (ref 26.0–34.0)
MCHC: 33.7 g/dL (ref 30.0–36.0)
MCV: 99.1 fL (ref 80.0–100.0)
Platelets: 262 10*3/uL (ref 150–400)
RBC: 3.47 MIL/uL — ABNORMAL LOW (ref 3.87–5.11)
RDW: 12.8 % (ref 11.5–15.5)
WBC: 13.8 10*3/uL — ABNORMAL HIGH (ref 4.0–10.5)
nRBC: 0 % (ref 0.0–0.2)

## 2017-12-20 NOTE — Lactation Note (Signed)
This note was copied from a baby's chart. Lactation Consultation Note  Patient Name: Jade Ida Roguedriana Dunlap DEYCX'KToday's Date: 12/20/2017 Reason for consult: Initial assessment;Early term 5237-38.6wks  Baby is 26 hours old  4% weight loss  LC reviewed and updated the doc flow sheets for consult.  Per mom breast feeding is going well , baby was cluster feeding and  Seemed not satisfied so I fed formula. Only 5 ml.  Baby asleep. Mom ready for D/C.  LC reviewed sore nipple and engorgement prevention and tx reviewed.  Mom declined manuel pump and will have a DEBP Medela.  LC provided mom her UMR DEBP - Backpack as mom requested.  LC stressed the importance of consistent STS with feedings/ nutritive vs non - nutritive  Feeding patterns and to watch for hanging out latched.  Per mom having some soreness/ LC reminded mom to use her shells between feedings Except when sleeping.  Mother informed of post-discharge support and given phone number to the lactation department, including services for phone call assistance; out-patient appointments; and breastfeeding support group. List of other breastfeeding resources in the community given in the handout. Encouraged mother to call for problems or concerns related to breastfeeding.    Maternal Data Has patient been taught Hand Expression?: Yes  Feeding Feeding Type: Breast Fed  LATCH Score                   Interventions Interventions: Breast feeding basics reviewed;Shells  Lactation Tools Discussed/Used Tools: Shells Shell Type: Inverted Pump Review: Milk Storage Initiated by:: MAI  Date initiated:: 12/20/17   Consult Status Consult Status: Complete Date: 12/20/17    Jade Walters 12/20/2017, 11:45 AM

## 2017-12-20 NOTE — Discharge Summary (Signed)
Obstetric Discharge Summary Reason for Admission: onset of labor Prenatal Procedures: none Intrapartum Procedures: spontaneous vaginal delivery Postpartum Procedures: none Complications-Operative and Postpartum: none Hemoglobin  Date Value Ref Range Status  12/20/2017 11.6 (L) 12.0 - 15.0 g/dL Final   HCT  Date Value Ref Range Status  12/20/2017 34.4 (L) 36.0 - 46.0 % Final    Physical Exam:  General: alert, cooperative and appears stated age Lochia: appropriate Uterine Fundus: firm Incision: N/A DVT Evaluation: No evidence of DVT seen on physical exam. Negative Homan's sign. No cords or calf tenderness. No significant calf/ankle edema.  Discharge Diagnoses: Term Pregnancy-delivered  Discharge Information: Date: 12/20/2017 Activity: pelvic rest Diet: routine Medications: None Condition: stable Instructions: refer to practice specific booklet Discharge to: home   Newborn Data: Live born female  Birth Weight: 7 lb 11.6 oz (3505 g) APGAR: 9, 9  Newborn Delivery   Birth date/time:  12/19/2017 08:53:00 Delivery type:  Vaginal, Spontaneous    Declines circ.  Home with mother.  Jade Walters Jade Walters Jade Walters 12/20/2017, 9:29 AM

## 2017-12-30 ENCOUNTER — Inpatient Hospital Stay (HOSPITAL_COMMUNITY)
Admission: RE | Admit: 2017-12-30 | Discharge: 2017-12-30 | Disposition: A | Discharge status: Home / Self Care | Payer: 59 | Source: Ambulatory Visit | Attending: Obstetrics and Gynecology | Admitting: Obstetrics and Gynecology

## 2018-02-27 ENCOUNTER — Other Ambulatory Visit: Payer: Self-pay

## 2018-02-27 ENCOUNTER — Ambulatory Visit (HOSPITAL_COMMUNITY)
Admission: EM | Admit: 2018-02-27 | Discharge: 2018-02-27 | Disposition: A | Discharge status: Home / Self Care | Payer: No Typology Code available for payment source | Attending: Family Medicine | Admitting: Family Medicine

## 2018-02-27 ENCOUNTER — Encounter (HOSPITAL_COMMUNITY): Payer: Self-pay | Admitting: Emergency Medicine

## 2018-02-27 DIAGNOSIS — H6121 Impacted cerumen, right ear: Secondary | ICD-10-CM

## 2018-02-27 NOTE — Discharge Instructions (Addendum)
If you have recurrent problems, you may purchase a "Murine esr was removal kit" at your pharmacy

## 2018-02-27 NOTE — ED Provider Notes (Signed)
[facility]    CSN: [id number] Arrival date & time: 02/27/18  1532     History   Chief Complaint Chief Complaint  Patient presents with  . Otalgia    HPI Jade Walters is a 33 y.o. female.   HPI  Patient has a sensation of being underwater with diminished hearing and pressure in her right ear Since yesterday.  No cough cold runny nose or sore throat.  No ear pain.  No drainage from ear.  No prior problems with infections or hearing Past Medical History:  Diagnosis Date  . Allergy     Patient Active Problem List   Diagnosis Date Noted  . Indication for care in labor or delivery 12/19/2017    History reviewed. No pertinent surgical history.  OB History    Gravida  2   Para  2   Term  2   Preterm      AB      Living  2     SAB      TAB      Ectopic      Multiple  0   Live Births  2            Home Medications    Prior to Admission medications   Medication Sig Start Date End Date Taking? Authorizing Provider  Prenatal Vit-Fe Fumarate-FA (PRENATAL MULTIVITAMIN) TABS tablet Take 1 tablet by mouth daily at 12 noon.    [provider]    Family History Family History  Problem Relation Age of Onset  . Hypertension Father   . Hyperlipidemia Father   . Diabetes Maternal Grandmother     Social History Social History   Tobacco Use  . Smoking status: Never Smoker  . Smokeless tobacco: Never Used  Substance Use Topics  . Alcohol use: No  . Drug use: No     Allergies   Patient has no known allergies.   Review of Systems Review of Systems  Constitutional: Negative for chills and fever.  HENT: Positive for hearing loss. Negative for ear pain and sore throat.   Eyes: Negative for pain and visual disturbance.  Respiratory: Negative for cough and shortness of breath.   Cardiovascular: Negative for chest pain and palpitations.  Gastrointestinal: Negative for abdominal pain and vomiting.  Genitourinary: Negative for  dysuria and hematuria.  Musculoskeletal: Negative for arthralgias and back pain.  Skin: Negative for color change and rash.  Neurological: Negative for seizures and syncope.  All other systems reviewed and are negative.    Physical Exam Triage Vital Signs ED Triage Vitals  Enc Vitals Group     BP 02/27/18 1640 114/68     Pulse Rate 02/27/18 1640 78     Resp 02/27/18 1640 18     Temp 02/27/18 1640 97.9 F (36.6 C)     Temp Source 02/27/18 1640 Temporal     SpO2 02/27/18 1640 100 %     Weight --      Height --      Head Circumference --      Peak Flow --      Pain Score 02/27/18 1638 0     Pain Loc --      Pain Edu? --      Excl. in [place]? --    No data found.  Updated Vital Signs BP 114/68 (BP Location: Right Arm)   Pulse 78   Temp 97.9 F (36.6 C) (Temporal)   Resp 18  SpO2 100%   Breastfeeding Yes   Visual Acuity Right Eye Distance:   Left Eye Distance:   Bilateral Distance:    Right Eye Near:   Left Eye Near:    Bilateral Near:     Physical Exam Constitutional:      General: She is not in acute distress.    Appearance: She is well-developed and normal weight.  HENT:     Head: Normocephalic and atraumatic.     Comments: The left canal has a partial occlusion of cerumen.  The TM seen partially appears normal.  The right canal is completely occluded with cerumen.  After lavage and curette, the TM appears clear.  Patient symptoms improved    Right Ear: There is impacted cerumen.     Left Ear: There is impacted cerumen.     Nose: Nose normal.     Mouth/Throat:     Mouth: Mucous membranes are moist.     Pharynx: No posterior oropharyngeal erythema.  Eyes:     Conjunctiva/sclera: Conjunctivae normal.     Pupils: Pupils are equal, round, and reactive to light.  Neck:     Musculoskeletal: Normal range of motion.  Cardiovascular:     Rate and Rhythm: Normal rate and regular rhythm.     Heart sounds: Normal heart sounds.  Pulmonary:     Effort: Pulmonary  effort is normal. No respiratory distress.     Breath sounds: Normal breath sounds.  Abdominal:     General: There is no distension.     Palpations: Abdomen is soft.  Musculoskeletal: Normal range of motion.  Skin:    General: Skin is warm and dry.  Neurological:     Mental Status: She is alert.      UC Treatments / Results  Labs (all labs ordered are listed, but only abnormal results are displayed) Labs Reviewed - No data to display  EKG None  Radiology No results found.  Procedures Procedures (including critical care time)  Medications Ordered in UC Medications - No data to display  Initial Impression / Assessment and Plan / UC Course  I have reviewed the triage vital signs and the nursing notes.  Pertinent labs & imaging results that were available during my care of the patient were reviewed by me and considered in my medical decision making (see chart for details).      Final Clinical Impressions(s) / UC Diagnoses   Final diagnoses:  Impacted cerumen of right ear     Discharge Instructions     If you have recurrent problems, you may purchase a "Murine esr was removal kit" at your pharmacy    ED Prescriptions    None     Controlled Substance Prescriptions [place] Controlled Substance Registry consulted? Not Applicable   Raylene Everts, MD 02/27/18 2127

## 2018-02-27 NOTE — ED Triage Notes (Signed)
Patient was cleaning right ear with finger nail, noticed pain and noticed blood speck on nail.  This occurred yesterday.  Hearing is like when "getting off airplane".  No drainage noted.

## 2019-12-22 LAB — GLUCOSE, POCT (MANUAL RESULT ENTRY): POC Glucose: 105 mg/dl — AB (ref 70–99)

## 2020-02-01 ENCOUNTER — Other Ambulatory Visit: Payer: Self-pay

## 2020-02-01 ENCOUNTER — Ambulatory Visit (INDEPENDENT_AMBULATORY_CARE_PROVIDER_SITE_OTHER): Payer: Self-pay

## 2020-02-01 ENCOUNTER — Encounter (HOSPITAL_COMMUNITY): Payer: Self-pay | Admitting: Emergency Medicine

## 2020-02-01 ENCOUNTER — Ambulatory Visit (HOSPITAL_COMMUNITY)
Admission: EM | Admit: 2020-02-01 | Discharge: 2020-02-01 | Disposition: A | Discharge status: Home / Self Care | Payer: Self-pay | Attending: Emergency Medicine | Admitting: Emergency Medicine

## 2020-02-01 DIAGNOSIS — S8001XA Contusion of right knee, initial encounter: Secondary | ICD-10-CM

## 2020-02-01 DIAGNOSIS — M25561 Pain in right knee: Secondary | ICD-10-CM

## 2020-02-01 MED ORDER — NAPROXEN 500 MG PO TABS: 500.0000 mg | ORAL_TABLET | Freq: Two times a day (BID) | ORAL | 0 refills | Status: DC

## 2020-02-01 NOTE — ED Provider Notes (Signed)
[facility]   MRN: [id number] DOB: 21-Dec-1985  Subjective:   Jade Walters is a 35 y.o. female presenting for 1 day history of suffering a right knee injury.  Patient accidentally collided with her right knee against hard corner of a bench.  She has had significant pain, some swelling and bruising.  States that the extremes of range of motion including bending and extending are the most painful.  Would like to make sure she does not have a fracture.  No current facility-administered medications for this encounter.  Current Outpatient Medications:  .  Prenatal Vit-Fe Fumarate-FA (PRENATAL MULTIVITAMIN) TABS tablet, Take 1 tablet by mouth daily at 12 noon., Disp: , Rfl:    No Known Allergies  Past Medical History:  Diagnosis Date  . Allergy      History reviewed. No pertinent surgical history.  Family History  Problem Relation Age of Onset  . Hypertension Father   . Hyperlipidemia Father   . Diabetes Maternal Grandmother     Social History   Tobacco Use  . Smoking status: Never Smoker  . Smokeless tobacco: Never Used  Substance Use Topics  . Alcohol use: No  . Drug use: No    ROS   Objective:   Vitals: BP 122/82 (BP Location: Left Arm)   Pulse 88   Temp 98 F (36.7 C) (Oral)   Resp 18   LMP 01/11/2020 (Approximate)   SpO2 98%   Breastfeeding No   Physical Exam Constitutional:      General: She is not in acute distress.    Appearance: Normal appearance. She is well-developed. She is not ill-appearing.  HENT:     Head: Normocephalic and atraumatic.     Nose: Nose normal.     Mouth/Throat:     Mouth: Mucous membranes are moist.     Pharynx: Oropharynx is clear.  Eyes:     General: No scleral icterus.    Extraocular Movements: Extraocular movements intact.     Pupils: Pupils are equal, round, and reactive to light.  Cardiovascular:     Rate and Rhythm: Normal rate.  Pulmonary:     Effort: Pulmonary effort is normal.   Musculoskeletal:     Right knee: Swelling, ecchymosis and bony tenderness (over area outlined) present. No deformity, effusion, erythema, lacerations or crepitus. Decreased range of motion. Tenderness present over the lateral joint line. No medial joint line or patellar tendon tenderness. Normal alignment, normal meniscus and normal patellar mobility.       Legs:  Skin:    General: Skin is warm and dry.  Neurological:     General: No focal deficit present.     Mental Status: She is alert and oriented to person, place, and time.  Psychiatric:        Mood and Affect: Mood normal.        Behavior: Behavior normal.    DG Knee Complete 4 Views Right  Result Date: 02/01/2020 CLINICAL DATA:  Pain following injury EXAM: RIGHT KNEE - COMPLETE 4+ VIEW COMPARISON:  None. FINDINGS: Frontal, oblique, lateral, and sunrise patellar images were obtained. No fracture or dislocation. No joint effusion. Joint spaces appear normal. No erosive change. IMPRESSION: No fracture, dislocation, or joint effusion. No evident arthropathy. Electronically Signed   By: Bretta Bang III M.D.   On: 02/01/2020 13:46   Assessment and Plan :   PDMP not reviewed this encounter.  1. Acute pain of right knee   2. Contusion of right  knee, initial encounter     Recommended conservative management for right knee contusion.  Ace wrap applied to the right knee.  Follow-up with Ortho if symptoms persist.  Otherwise use RICE method, naproxen for pain and inflammation. Counseled patient on potential for adverse effects with medications prescribed/recommended today, ER and return-to-clinic precautions discussed, patient verbalized understanding.    Wallis Bamberg, PA-C 02/01/20 1430

## 2020-02-01 NOTE — ED Triage Notes (Signed)
Pt states that she hit her right knee last night and it is hard to bend her knee 90 degree. Pt states does have bruising and swelling in her knee and would like an x ray to make sure it is not fractured.

## 2021-04-23 IMAGING — DX DG KNEE COMPLETE 4+V*R*
4 series · 4 of 4 positions shown · non-contrast
Comparison: None.

CLINICAL DATA: Pain following injury

EXAM:
RIGHT KNEE - COMPLETE 4+ VIEW

[knee ap]
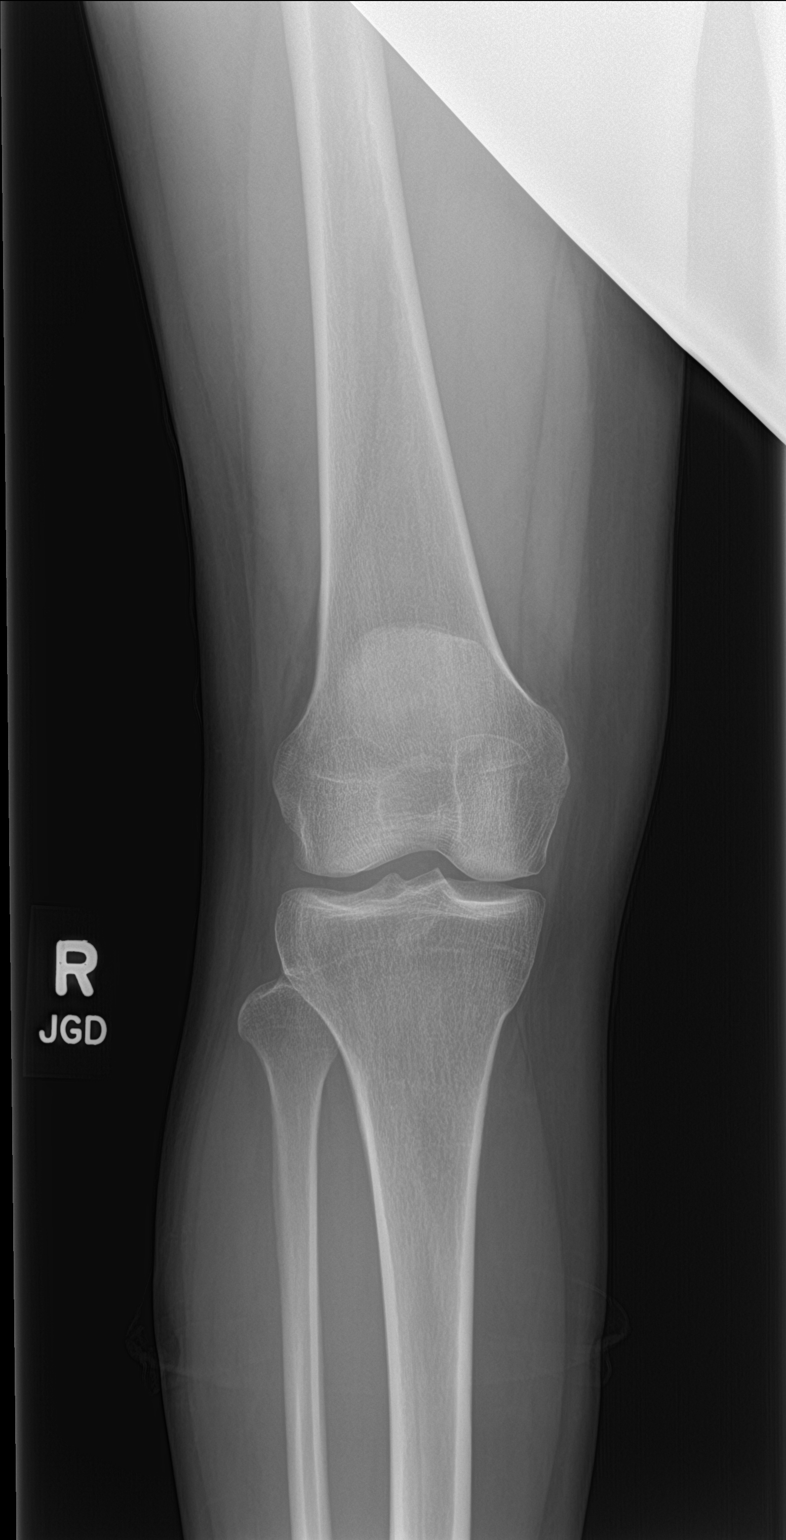

[knee obl]
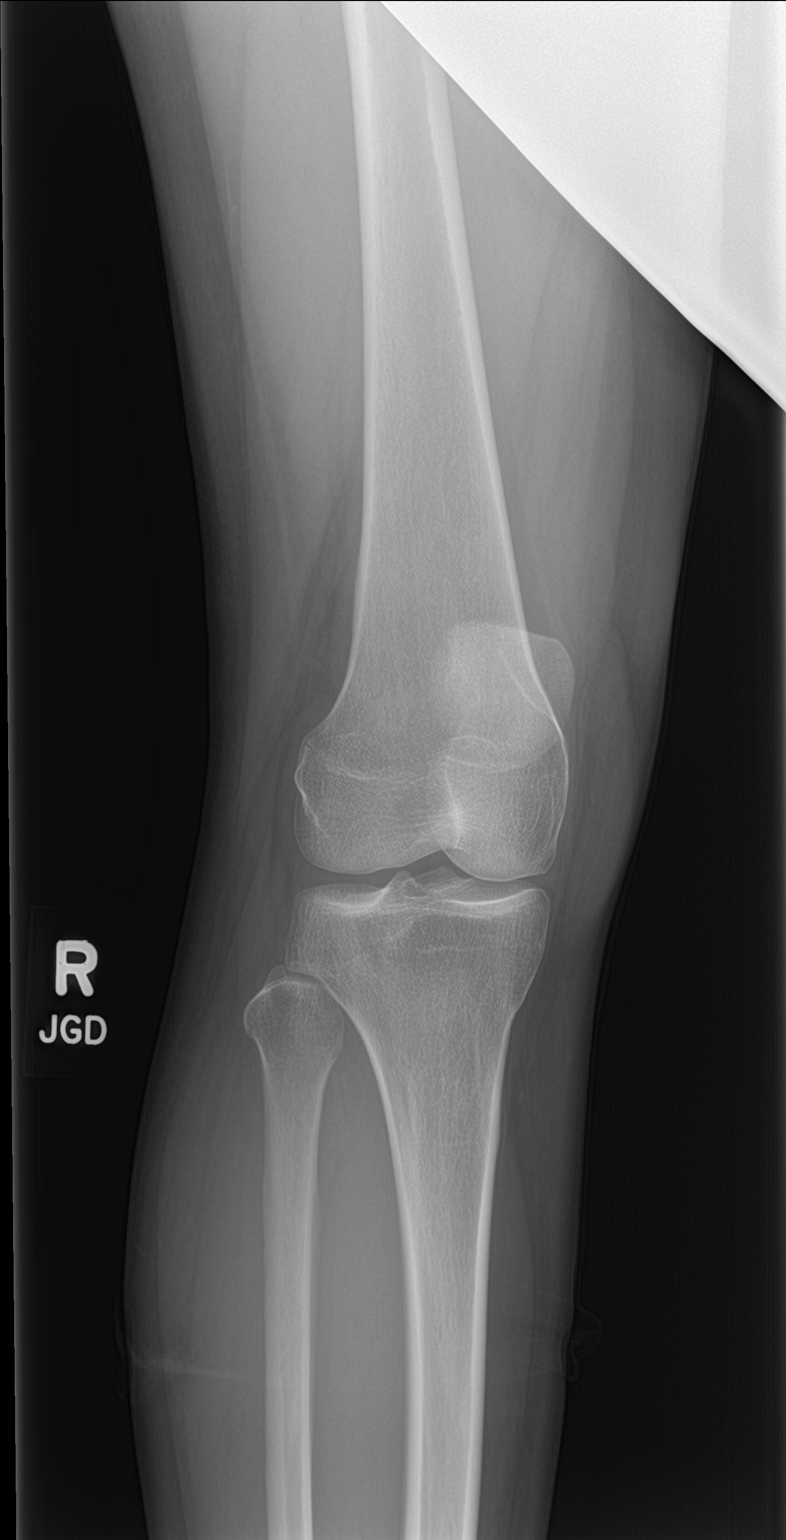

[knee lat]
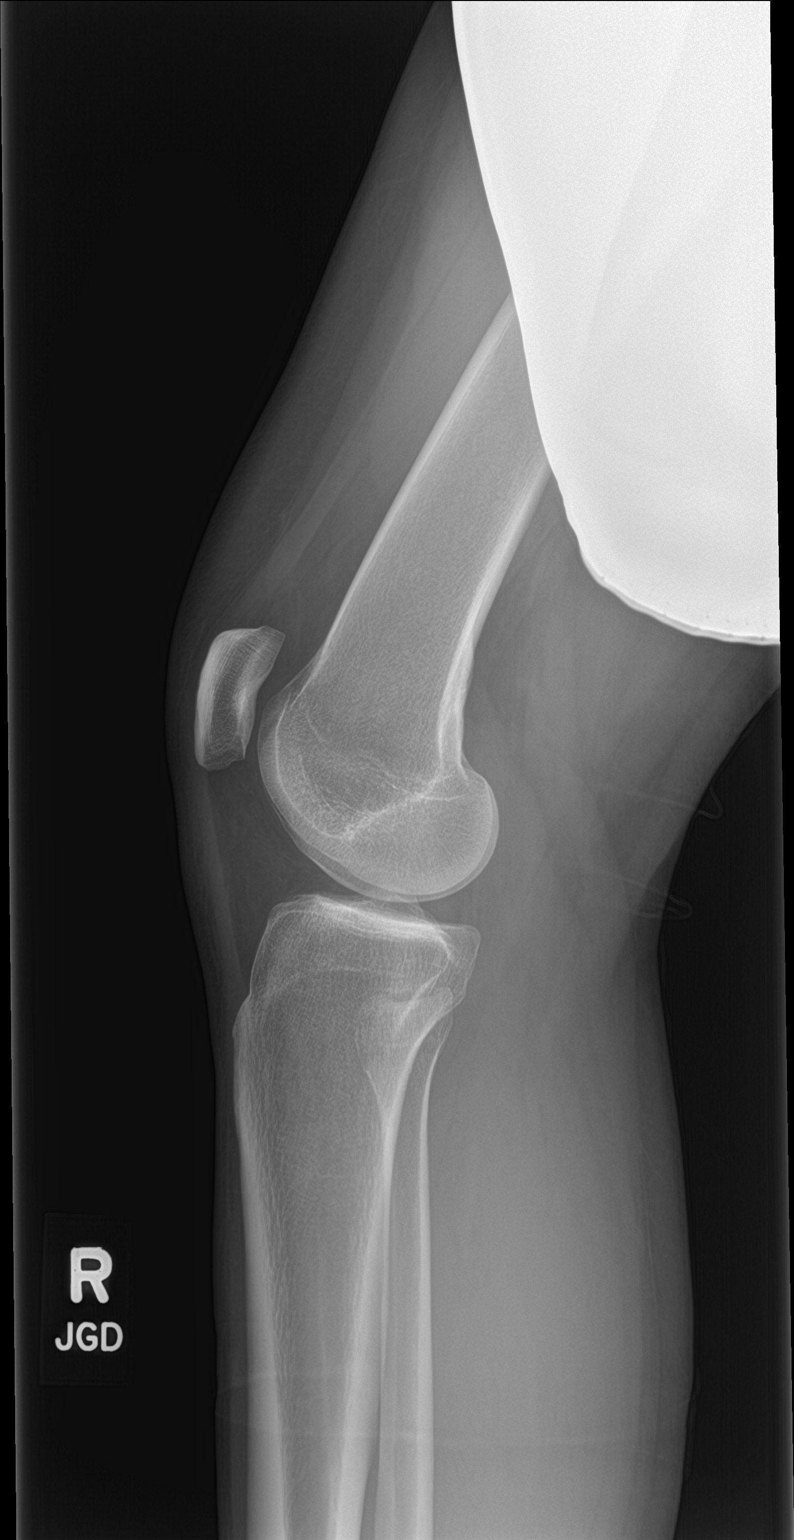

[knee sunrise]
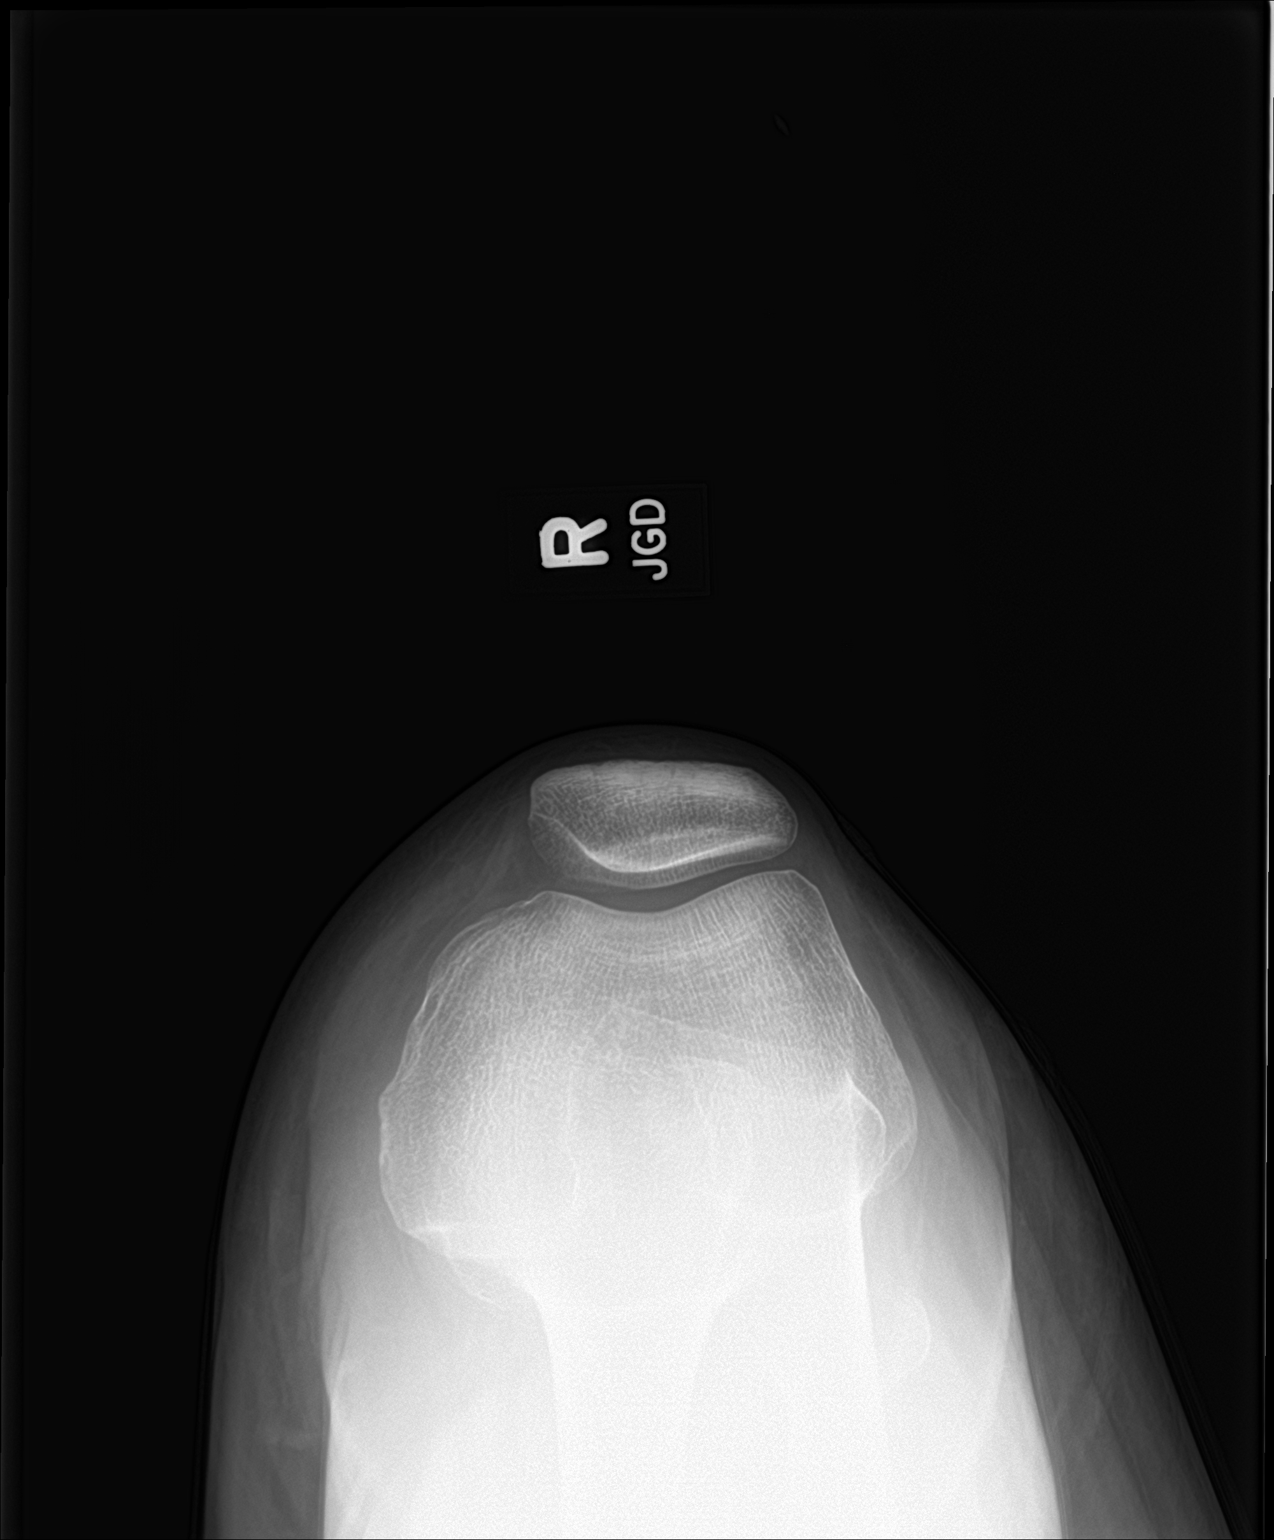

[4 of 4 positions shown; findings below may reference images not displayed]

FINDINGS: Frontal, oblique, lateral, and sunrise patellar images were
obtained. No fracture or dislocation. No joint effusion. Joint
spaces appear normal. No erosive change.
IMPRESSION: No fracture, dislocation, or joint effusion. No evident arthropathy.

## 2023-08-13 ENCOUNTER — Other Ambulatory Visit (HOSPITAL_COMMUNITY)
Admission: RE | Admit: 2023-08-13 | Discharge: 2023-08-13 | Disposition: A | Discharge status: Home / Self Care | Payer: Self-pay | Source: Ambulatory Visit | Attending: Certified Nurse Midwife | Admitting: Certified Nurse Midwife

## 2023-08-13 ENCOUNTER — Encounter (HOSPITAL_BASED_OUTPATIENT_CLINIC_OR_DEPARTMENT_OTHER): Payer: Self-pay | Admitting: Certified Nurse Midwife

## 2023-08-13 ENCOUNTER — Ambulatory Visit (INDEPENDENT_AMBULATORY_CARE_PROVIDER_SITE_OTHER): Payer: Self-pay | Admitting: Certified Nurse Midwife

## 2023-08-13 VITALS — BP 120/80 | HR 84 | Wt 155.4 lb

## 2023-08-13 DIAGNOSIS — O09529 Supervision of elderly multigravida, unspecified trimester: Secondary | ICD-10-CM | POA: Insufficient documentation

## 2023-08-13 DIAGNOSIS — Z3480 Encounter for supervision of other normal pregnancy, unspecified trimester: Secondary | ICD-10-CM | POA: Insufficient documentation

## 2023-08-13 DIAGNOSIS — Z822 Family history of deafness and hearing loss: Secondary | ICD-10-CM | POA: Insufficient documentation

## 2023-08-13 DIAGNOSIS — O09521 Supervision of elderly multigravida, first trimester: Secondary | ICD-10-CM

## 2023-08-13 DIAGNOSIS — Z3A12 12 weeks gestation of pregnancy: Secondary | ICD-10-CM

## 2023-08-13 NOTE — Progress Notes (Signed)
 New OB Intake  I explained I am completing New OB Intake today. We discussed EDD of 02/23/24  based on certain LMP confirmed by US  at 8w. Pt is G3P2002. I reviewed her allergies, medications and Medical/Surgical/OB history.  Encouraged pt to start ASA 81mg  po once daily (AMA)  Pt lives with her supportive spouse Anton, her son [name] will be attending IB Program at [employer], youngest son Dan will attend Kindergarten this year. He was born w/ hearing impairment and has hearing aids which are effective. She is a Stay at [employer]. 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   Patient Active Problem List   Diagnosis Date Noted   Indication for care in labor or delivery 12/19/2017     Concerns addressed today  Delivery Plans Plans to deliver at [facility]. Discussed the nature of our practice with multiple providers including residents and students. Due to the size of the practice, the delivering provider may not be the same as those providing prenatal care.   Patient is not interested in water birth.  MyChart/Babyscripts MyChart access verified. I explained pt will have some visits in office and some virtually. Babyscripts instructions given and order placed. Patient verifies receipt of registration text/e-mail. Account successfully created and app downloaded. If patient is a candidate for Optimized scheduling, add to sticky note.   Blood Pressure Cuff/Weight Scale Patient is self-pay; explained patient will be given BP cuff at first prenatal appt. Explained after first prenatal appt pt will check weekly and document in Babyscripts. Patient does have weight scale.  Anatomy US  Explained first scheduled US  will be around 19 weeks. Anatomy US  ordered  Is patient a CenteringPregnancy candidate?  Declined Declined due to Schedule    Is patient a Mom+Baby Combined Care candidate?  Declined   If accepted, confirm patient does not intend to move from the area for at least 12  months, then notify Mom+Baby staff  Is patient a candidate for Babyscripts Optimization? Yes, patient declined   First visit review I reviewed new OB appt with patient.  Discussed [place] genetic screening with patient. Pt declines  Panorama and Horizon.. Routine prenatal labs is   Last Pap Pt states most recent pap was within 1-2 years and was Negative with no Hx of abnormal pap smear. Plan pap smear at postpartum visit.  Arland MARLA Roller, CNM 08/13/2023  12:23 PM       INITIAL PRENATAL VISIT  Subjective:   Jade Walters is being seen today for her first obstetrical visit.  This is a planned pregnancy. This is a desired pregnancy.  She is at [redacted]w[redacted]d gestation by certain LMP confirmed by US  at 8w.  Her obstetrical history is significant for advanced maternal age and her 2nd son born with hearing impairment. Relationship with FOB: spouse, living together. Patient does intend to breast feed. Pregnancy history fully reviewed.  Review of Systems:   Review of Systems  Objective:    Obstetric History OB History  Gravida Para Term Preterm AB Living  3 2 2  0 0 2  SAB IAB Ectopic Multiple Live Births  0 0 0 0 2    # Outcome Date GA Lbr Len/2nd Weight Sex Type Anes PTL Lv  3 Current           2 Term 12/19/17 [redacted]w[redacted]d 02:43 / 00:10 7 lb 11.6 oz (3.505 kg) M Vag-Spont EPI  LIV     Birth Comments: Congenital Hearing Impairment  1 Term 01/07/09 [redacted]w[redacted]d  8  lb 14 oz (4.026 kg) M Vag-Spont  N LIV    Past Medical History:  Diagnosis Date   Allergy    Vaginal delivery     No past surgical history on file.  Current Outpatient Medications on File Prior to Visit  Medication Sig Dispense Refill   Prenatal Vit-Fe Fumarate-FA (PRENATAL MULTIVITAMIN) TABS tablet Take 1 tablet by mouth daily at 12 noon.     naproxen  (NAPROSYN ) 500 MG tablet Take 1 tablet (500 mg total) by mouth 2 (two) times daily with a meal. (Patient not taking: Reported on 08/13/2023) 30 tablet 0   No current facility-administered  medications on file prior to visit.    No Known Allergies  Social History:  reports that she has never smoked. She has never used smokeless tobacco. She reports that she does not drink alcohol and does not use drugs.  Family History  Problem Relation Age of Onset   Hypertension Father    Hyperlipidemia Father    Hearing loss Son    Diabetes Maternal Grandmother     The following portions of the patient's history were reviewed and updated as appropriate: allergies, current medications, past family history, past medical history, past social history, past surgical history and problem list.  Review of Systems Review of Systems  Constitutional: Negative.   HENT: Negative.    Eyes: Negative.   Respiratory: Negative.    Cardiovascular: Negative.   Gastrointestinal: Negative.   Endocrine: Negative.   Genitourinary: Negative.   Musculoskeletal: Negative.   Skin: Negative.   Allergic/Immunologic: Negative.   Neurological: Negative.   Hematological: Negative.   Psychiatric/Behavioral: Negative.    All other systems reviewed and are negative.     Physical Exam:  BP 120/80   Pulse 84   Wt 155 lb 6.4 oz (70.5 kg)   LMP 05/19/2023   BMI 25.86 kg/m  CONSTITUTIONAL: Well-developed, well-nourished female in no acute distress.  HENT:  Normocephalic, atraumatic.  Oropharynx is clear and moist EYES: Conjunctivae normal. No scleral icterus.  NECK: Normal range of motion, supple, no masses.  Normal thyroid.  SKIN: Skin is warm and dry. No rash noted. Not diaphoretic. No erythema. No pallor. MUSCULOSKELETAL: Normal range of motion. No tenderness.  No cyanosis, clubbing, or edema.   NEUROLOGIC: Alert and oriented to person, place, and time. Normal muscle tone coordination.  PSYCHIATRIC: Normal mood and affect. Normal behavior. Normal judgment and thought content. CARDIOVASCULAR: Normal heart rate noted, regular rhythm RESPIRATORY: Clear to auscultation bilaterally. Effort and breath  sounds normal, no problems with respiration noted.   Fetal Heart Rate (bpm): 154   Movement: Absent       Assessment:    Pregnancy: G3P2002  1. Supervision of other normal pregnancy, antepartum (Primary) - Continue prenatal vitamin - ASA 81mg  po once daily encouraged - Pt declines Panorama and Horizon today - ABO/Rh - Antibody screen - CBC - Hepatitis B surface antigen - HIV Antibody (routine testing w rflx) - HIV (Save tube for possible reflex) - RPR - Rubella screen - Hepatitis C antibody - Culture, OB Urine - Cervicovaginal ancillary only( [place])  2. Family history of congenital hearing loss - 2nd son born with congenital hearing impairment - US  MFM OB DETAIL +14 WK; Future  3. Antepartum multigravida of advanced maternal age - US  MFM OB DETAIL +14 WK; Future     Plan:    Initial labs drawn. Prenatal vitamins. ASA 81mg  po once daily Problem list reviewed and updated. Reviewed in detail the nature  of the practice with collaborative care between  Genetic screening discussed: NIPS/First trimester screen/Quad/AFP declined. Role of ultrasound in pregnancy discussed; Anatomy US : requested. Amniocentesis discussed: not indicated. Follow up in 4 weeks. Weight gain recommendations per IOM guidelines reviewed: underweight/BMI 18.5 or less > 28 - 40 lbs; normal weight/BMI 18.5 - 24.9 > 25 - 35 lbs; overweight/BMI 25 - 29.9 > 15 - 25 lbs; obese/BMI  30 or more > 11 - 20 lbs.  Discussed clinic routines, schedule of care and testing, genetic screening options, involvement of students and residents under the direct supervision of APPs and doctors and presence of female providers. Pt verbalized understanding.   Tad Arland POUR, CNM 08/13/2023 12:23 PM

## 2023-08-14 ENCOUNTER — Ambulatory Visit (HOSPITAL_BASED_OUTPATIENT_CLINIC_OR_DEPARTMENT_OTHER): Payer: Self-pay | Admitting: Certified Nurse Midwife

## 2023-08-14 LAB — CERVICOVAGINAL ANCILLARY ONLY
Chlamydia: NEGATIVE
Comment: NEGATIVE
Comment: NORMAL
Neisseria Gonorrhea: NEGATIVE

## 2023-08-14 LAB — ANTIBODY SCREEN: Antibody Screen: NEGATIVE

## 2023-08-14 LAB — HEPATITIS C ANTIBODY
HCV Ab: NEGATIVE
Hep C Virus Ab: NONREACTIVE

## 2023-08-14 LAB — CBC
Hematocrit: 39.8 % (ref 34.0–46.6)
Hemoglobin: 13.2 g/dL (ref 11.1–15.9)
MCH: 32 pg (ref 26.6–33.0)
MCHC: 33.2 g/dL (ref 31.5–35.7)
MCV: 96 fL (ref 79–97)
Platelets: 322 x10E3/uL (ref 150–450)
RBC: 4.13 x10E6/uL (ref 3.77–5.28)
RDW: 12.1 % (ref 11.7–15.4)
WBC: 8.2 x10E3/uL (ref 3.4–10.8)

## 2023-08-14 LAB — ABO/RH: Rh Factor: POSITIVE

## 2023-08-14 LAB — RPR: RPR Ser Ql: NONREACTIVE

## 2023-08-14 LAB — RUBELLA SCREEN: Rubella Antibodies, IGG: 1.79 {index} (ref >0.99)

## 2023-08-14 LAB — HEPATITIS B SURFACE ANTIGEN: Hepatitis B Surface Ag: NEGATIVE

## 2023-08-14 LAB — HIV ANTIBODY (ROUTINE TESTING W REFLEX): HIV Screen 4th Generation wRfx: NONREACTIVE

## 2023-08-15 LAB — URINE CULTURE, OB REFLEX

## 2023-08-15 LAB — CULTURE, OB URINE

## 2023-08-26 ENCOUNTER — Inpatient Hospital Stay (HOSPITAL_COMMUNITY)
Admission: AD | Admit: 2023-08-26 | Discharge: 2023-08-26 | Disposition: A | Discharge status: Home / Self Care | Payer: Self-pay | Attending: Obstetrics & Gynecology | Admitting: Obstetrics & Gynecology

## 2023-08-26 ENCOUNTER — Encounter (HOSPITAL_COMMUNITY): Payer: Self-pay | Admitting: Obstetrics & Gynecology

## 2023-08-26 DIAGNOSIS — O23592 Infection of other part of genital tract in pregnancy, second trimester: Secondary | ICD-10-CM | POA: Insufficient documentation

## 2023-08-26 DIAGNOSIS — N760 Acute vaginitis: Secondary | ICD-10-CM | POA: Diagnosis not present

## 2023-08-26 DIAGNOSIS — B9689 Other specified bacterial agents as the cause of diseases classified elsewhere: Secondary | ICD-10-CM | POA: Diagnosis not present

## 2023-08-26 DIAGNOSIS — Z3A14 14 weeks gestation of pregnancy: Secondary | ICD-10-CM

## 2023-08-26 DIAGNOSIS — N898 Other specified noninflammatory disorders of vagina: Secondary | ICD-10-CM | POA: Diagnosis not present

## 2023-08-26 DIAGNOSIS — O26892 Other specified pregnancy related conditions, second trimester: Secondary | ICD-10-CM

## 2023-08-26 DIAGNOSIS — M791 Myalgia, unspecified site: Secondary | ICD-10-CM | POA: Diagnosis not present

## 2023-08-26 LAB — URINALYSIS, ROUTINE W REFLEX MICROSCOPIC
Bacteria, UA: NONE SEEN
Bilirubin Urine: NEGATIVE
Glucose, UA: NEGATIVE mg/dL
Hgb urine dipstick: NEGATIVE
Ketones, ur: NEGATIVE mg/dL
Leukocytes,Ua: NEGATIVE
Nitrite: NEGATIVE
Protein, ur: NEGATIVE mg/dL
Specific Gravity, Urine: 1.02 (ref 1.005–1.030)
pH: 5 (ref 5.0–8.0)

## 2023-08-26 LAB — WET PREP, GENITAL
Sperm: NONE SEEN
Trich, Wet Prep: NONE SEEN
WBC, Wet Prep HPF POC: <10 (ref <10)
Yeast Wet Prep HPF POC: NONE SEEN

## 2023-08-26 MED ORDER — METRONIDAZOLE 500 MG PO TABS: 500.0000 mg | ORAL_TABLET | Freq: Two times a day (BID) | ORAL | 0 refills | Status: DC

## 2023-08-26 MED ORDER — METRONIDAZOLE 0.75 % VA GEL: 1.0000 | Freq: Every day | VAGINAL | 0 refills | Status: DC

## 2023-08-26 NOTE — MAU Provider Note (Addendum)
 MAU Note    S [name] is a 38 y.o. G39P2002 female at [redacted]w[redacted]d who presents to MAU today with complaint of vaginal discharge, possible spotting.  She endorses scant brown discharge when using the bathroom this morning.  When she wiped, there was yellow/brown discharge on the toilet paper.  She noted more discharge this afternoon. Mild cramping. Denies LOF, urinary symptoms, fever/chills. Had an ultrasound at 10 weeks at [facility] care [facility]; unable to see these results.   Receives care at [facility]. Prenatal records reviewed.  Pertinent items noted in HPI and remainder of comprehensive ROS otherwise negative.   O BP 117/81 (BP Location: Right Arm)   Pulse 89   Temp 98.1 F (36.7 C) (Oral)   Resp 16   Ht 5' 5 (1.651 m)   Wt 72.1 kg   LMP 05/19/2023   SpO2 100%   BMI 26.46 kg/m   Physical Exam Vitals reviewed.  Constitutional:      General: She is not in acute distress.    Appearance: She is well-developed. She is not diaphoretic.  Eyes:     General: No scleral icterus. Pulmonary:     Effort: Pulmonary effort is normal. No respiratory distress.  Skin:    General: Skin is warm and dry.  Neurological:     Mental Status: She is alert.     Coordination: Coordination normal.   BSUS: 14 week IUP visualized with excellent fetal movement. Anterior/right lateral placenta  Results for orders placed or performed during the hospital encounter of 08/26/23 (from the past 24 hours)  Urinalysis, Routine w reflex microscopic -Urine, Clean Catch     Status: None   Collection Time: 08/26/23  7:45 PM  Result Value Ref Range   Color, Urine YELLOW YELLOW   APPearance CLEAR CLEAR   Specific Gravity, Urine 1.020 1.005 - 1.030   pH 5.0 5.0 - 8.0   Glucose, UA NEGATIVE NEGATIVE mg/dL   Hgb urine dipstick NEGATIVE NEGATIVE   Bilirubin Urine NEGATIVE NEGATIVE   Ketones, ur NEGATIVE NEGATIVE mg/dL   Protein, ur NEGATIVE NEGATIVE mg/dL   Nitrite NEGATIVE NEGATIVE   Leukocytes,Ua  NEGATIVE NEGATIVE   RBC / HPF 0-5 0 - 5 RBC/hpf   WBC, UA 0-5 0 - 5 WBC/hpf   Bacteria, UA NONE SEEN NONE SEEN   Squamous Epithelial / HPF 0-5 0 - 5 /HPF   Mucus PRESENT   Wet prep, genital     Status: Abnormal   Collection Time: 08/26/23  8:43 PM  Result Value Ref Range   Yeast Wet Prep HPF POC NONE SEEN NONE SEEN   Trich, Wet Prep NONE SEEN NONE SEEN   Clue Cells Wet Prep HPF POC PRESENT (A) NONE SEEN   WBC, Wet Prep HPF POC <10 <10   Sperm NONE SEEN     MDM: Moderate MAU Course: -Vital signs within normal limits. -Fetal heart tones within normal limits. -UA and wet prep for possible infection. -UA negative for infection. Wet prep positive for BV, treat with metronidazole . -BSUS confirmed IUP.  A 1. Vaginal discharge during pregnancy in second trimester (Primary) - Discharge patient  2. Bacterial vaginosis in pregnancy - Discharge patient  3. [redacted] weeks gestation of pregnancy - Discharge patient   Medical screening exam complete  P Metrogel  x7 days. Follow-up with [facility] for further prenatal care.  Future Appointments  Date Time Provider Department Center  10/01/2023 10:00 AM [facility] PROVIDER 1 [facility]  10/01/2023 10:30 AM [facility] US5 [facility]  Allergies as of 08/26/2023   No Known Allergies      Medication List     STOP taking these medications    naproxen  500 MG tablet Commonly known as: NAPROSYN        TAKE these medications    metroNIDAZOLE  0.75 % vaginal gel Commonly known as: METROGEL  Place 1 Applicatorful vaginally at bedtime.   prenatal multivitamin Tabs tablet Take 1 tablet by mouth daily at 12 noon.        Almarie CHRISTELLA Moats, MD

## 2023-08-26 NOTE — MAU Note (Signed)
 MAU Triage Note:  .Jade Walters is a 38 y.o. at [redacted]w[redacted]d here in MAU reporting: noticed a scant amount of brown discharge when voiding this morning. When she wiped she also noticed yellowish/brownish discharge that was thicker in appearance. She reports that it does not smell like urine. She had another episode of the discharge this afternoon.   Patient complaint: discolored discharge could be blood  Pain Score: 1  Pain Location: Abdomen     Onset of complaint: this morning LMP: Patient's last menstrual period was 05/19/2023.  There were no vitals filed for this visit.  FHT:  Fetal Heart Rate Mode: Doppler Baseline Rate (A): 143 bpm Multiple birth?: No Lab orders placed from triage: wet prep, G/C

## 2023-08-26 NOTE — Discharge Instructions (Signed)
 You have an overgrowth of bacteria in your vagina called bacterial vaginosis. This is not a sexually transmitted infection.  Sexual partners do not need to be treated, however abstaining from sex or using condoms may prevent recurrence of the overgrowth. Some women have a recurrence of the overgrowth even when fully treated.  Call the office if your symptoms begin again.  Do not douche as this is associated with decreased cure rates and more bacterial overgrowths. Take the full course of the antibiotic prescribed to you even if you begin to feel better. Do not drink alcohol with Flagyl  as the drug will cause an upset stomach if you do drink.  Reasons to return to [facility]U at [facility]: If you have heavier bleeding that soaks through more that 2 pads per hour for an hour or more If you bleed so much that you feel like you might pass out or you do pass out If you have significant abdominal pain that is not improved with Tylenol  1000 mg every 8 hours as needed for pain If you develop a fever > 100.5

## 2023-08-27 ENCOUNTER — Inpatient Hospital Stay (HOSPITAL_COMMUNITY)
Admission: AD | Admit: 2023-08-27 | Discharge: 2023-08-27 | Disposition: A | Discharge status: Home / Self Care | Payer: Self-pay | Attending: Obstetrics & Gynecology | Admitting: Obstetrics & Gynecology

## 2023-08-27 ENCOUNTER — Other Ambulatory Visit: Payer: Self-pay

## 2023-08-27 DIAGNOSIS — M791 Myalgia, unspecified site: Secondary | ICD-10-CM | POA: Diagnosis not present

## 2023-08-27 DIAGNOSIS — B9689 Other specified bacterial agents as the cause of diseases classified elsewhere: Secondary | ICD-10-CM | POA: Diagnosis not present

## 2023-08-27 DIAGNOSIS — O26892 Other specified pregnancy related conditions, second trimester: Secondary | ICD-10-CM

## 2023-08-27 DIAGNOSIS — Z3A14 14 weeks gestation of pregnancy: Secondary | ICD-10-CM

## 2023-08-27 DIAGNOSIS — O23592 Infection of other part of genital tract in pregnancy, second trimester: Secondary | ICD-10-CM | POA: Insufficient documentation

## 2023-08-27 DIAGNOSIS — N760 Acute vaginitis: Secondary | ICD-10-CM | POA: Diagnosis not present

## 2023-08-27 DIAGNOSIS — M7918 Myalgia, other site: Secondary | ICD-10-CM | POA: Insufficient documentation

## 2023-08-27 LAB — GC/CHLAMYDIA PROBE AMP (~~LOC~~) NOT AT ARMC
Chlamydia: NEGATIVE
Comment: NEGATIVE
Comment: NORMAL
Neisseria Gonorrhea: NEGATIVE

## 2023-08-27 MED ORDER — CYCLOBENZAPRINE HCL 10 MG PO TABS: 10.0000 mg | ORAL_TABLET | Freq: Two times a day (BID) | ORAL | 0 refills | Status: DC | PRN

## 2023-08-27 MED ORDER — METRONIDAZOLE 500 MG PO TABS: 500.0000 mg | ORAL_TABLET | Freq: Two times a day (BID) | ORAL | 0 refills | Status: DC

## 2023-08-27 NOTE — MAU Provider Note (Signed)
 S [name] is a 38 y.o. [id number] patient who presents to MAU today at [redacted]w[redacted]d with complaint of scant vaginal spotting after wiping when she used the Bathroom.  Patient was seen yesterday and DX with BV . But was unable to pick up her RX due to a pharmacy issue per patient report.. She denies any bright red vaginal bleeding and offers no c/o urinary discomfort or irration. Full evaluation with BSUS was performed yesterday .   Receives care at [facility]. Prenatal records reviewed.   Pertinent items noted in HPI and remainder of comprehensive ROS otherwise negative.   O BP 113/70 (BP Location: Right Arm)   Pulse 87   Temp 98.5 F (36.9 C) (Oral)   Resp 17   Ht 5' 5 (1.651 m)   Wt 72 kg   LMP 05/19/2023   SpO2 100%   BMI 26.41 kg/m  Physical Exam Vitals and nursing note reviewed.  Constitutional:      General: She is not in acute distress.    Appearance: Normal appearance. She is not ill-appearing.  HENT:     Head: Normocephalic.     Nose: Nose normal.     Mouth/Throat:     Mouth: Mucous membranes are moist.  Cardiovascular:     Rate and Rhythm: Normal rate.  Pulmonary:     Effort: Pulmonary effort is normal.  Abdominal:     Palpations: Abdomen is soft.     Tenderness: There is no abdominal tenderness.  Musculoskeletal:        General: Normal range of motion.     Cervical back: Normal range of motion.  Skin:    General: Skin is warm.  Neurological:     Mental Status: She is alert and oriented to person, place, and time.  Psychiatric:        Mood and Affect: Mood normal.        Behavior: Behavior normal.    FHT: 152 via Doppler  MDM  MODERATE  VSS Prenatal records reviewed  Physical exam performed UA with wet prep repeated ( Wet prep c/w BV- patient has not treated yet)  Plan to change RX to Flagyl  PO - new RX sent    Results for orders placed or performed during the hospital encounter of 08/26/23 (from the past 24 hours)  Urinalysis,  Routine w reflex microscopic -Urine, Clean Catch     Status: None   Collection Time: 08/26/23  7:45 PM  Result Value Ref Range   Color, Urine YELLOW YELLOW   APPearance CLEAR CLEAR   Specific Gravity, Urine 1.020 1.005 - 1.030   pH 5.0 5.0 - 8.0   Glucose, UA NEGATIVE NEGATIVE mg/dL   Hgb urine dipstick NEGATIVE NEGATIVE   Bilirubin Urine NEGATIVE NEGATIVE   Ketones, ur NEGATIVE NEGATIVE mg/dL   Protein, ur NEGATIVE NEGATIVE mg/dL   Nitrite NEGATIVE NEGATIVE   Leukocytes,Ua NEGATIVE NEGATIVE   RBC / HPF 0-5 0 - 5 RBC/hpf   WBC, UA 0-5 0 - 5 WBC/hpf   Bacteria, UA NONE SEEN NONE SEEN   Squamous Epithelial / HPF 0-5 0 - 5 /HPF   Mucus PRESENT   Wet prep, genital     Status: Abnormal   Collection Time: 08/26/23  8:43 PM  Result Value Ref Range   Yeast Wet Prep HPF POC NONE SEEN NONE SEEN   Trich, Wet Prep NONE SEEN NONE SEEN   Clue Cells Wet Prep HPF POC PRESENT (A) NONE SEEN  WBC, Wet Prep HPF POC <10 <10   Sperm NONE SEEN        I have reviewed the patient chart and performed the physical exam . I have ordered & interpreted the lab results and reviewed and interpreted the results with the patient  Medications ordered as stated below.  A/P as described below.  Counseling and education provided and patient agreeable  with plan as described below. Verbalized understanding.    ASSESSMENT Medical screening exam complete  Bacterial vaginosis -Flagyl  500 mg BID x 7 days ordered -S/R/B d/w patient and she verbalized understanding  Muscle pain - Flexeril  10 mg ordered prn for back spasms   -S/R/B d/w patient and she verbalized understanding  PLAN Future Appointments  Date Time Provider Department Center  10/01/2023 10:00 AM [facility] PROVIDER 1 [facility]  10/01/2023 10:30 AM [facility] US5 [facility]    Discharge from MAU in stable condition  Allergies as of 08/27/2023   No Known Allergies      Medication List     STOP taking these medications    metroNIDAZOLE  0.75  % vaginal gel Commonly known as: METROGEL        TAKE these medications    cyclobenzaprine  10 MG tablet Commonly known as: FLEXERIL  Take 1 tablet (10 mg total) by mouth 2 (two) times daily as needed for muscle spasms.   metroNIDAZOLE  500 MG tablet Commonly known as: FLAGYL  Take 1 tablet (500 mg total) by mouth 2 (two) times daily.   prenatal multivitamin Tabs tablet Take 1 tablet by mouth daily at 12 noon.         See AVS for full description of educational information and instructions provided to the patient at time of discharge  Warning signs for worsening condition that would warrant emergency follow-up discussed Patient may return to MAU as needed   Littie Olam LABOR, NP 08/27/2023 10:59 AM

## 2023-08-27 NOTE — MAU Note (Signed)
 Jade Walters is a 38 y.o. at [redacted]w[redacted]d here in MAU reporting: VB (pink on picture- small amount) upon wiping after going to [facility] and upper back pain on right side. Here last night with vaginal discharge, Dx with BV and given medication but did not use due to picking it up this morning.   LMP:  Onset of complaint: 1000 Pain score: 3/10 Vitals:   08/27/23 1037  BP: 113/70  Pulse: 87  Resp: 17  Temp: 98.5 F (36.9 C)  SpO2: 100%     FHT: 152  Lab orders placed from triage: na

## 2023-09-09 ENCOUNTER — Ambulatory Visit (HOSPITAL_BASED_OUTPATIENT_CLINIC_OR_DEPARTMENT_OTHER): Payer: Self-pay | Admitting: Obstetrics & Gynecology

## 2023-09-09 ENCOUNTER — Other Ambulatory Visit (HOSPITAL_COMMUNITY)
Admission: RE | Admit: 2023-09-09 | Discharge: 2023-09-09 | Disposition: A | Discharge status: Home / Self Care | Payer: Self-pay | Source: Ambulatory Visit | Attending: Obstetrics & Gynecology | Admitting: Obstetrics & Gynecology

## 2023-09-09 VITALS — BP 121/86 | HR 82 | Wt 167.0 lb

## 2023-09-09 DIAGNOSIS — Z3A16 16 weeks gestation of pregnancy: Secondary | ICD-10-CM

## 2023-09-09 DIAGNOSIS — O4692 Antepartum hemorrhage, unspecified, second trimester: Secondary | ICD-10-CM | POA: Diagnosis not present

## 2023-09-09 DIAGNOSIS — Z822 Family history of deafness and hearing loss: Secondary | ICD-10-CM | POA: Diagnosis not present

## 2023-09-09 DIAGNOSIS — Z3480 Encounter for supervision of other normal pregnancy, unspecified trimester: Secondary | ICD-10-CM

## 2023-09-09 DIAGNOSIS — Z124 Encounter for screening for malignant neoplasm of cervix: Secondary | ICD-10-CM

## 2023-09-09 DIAGNOSIS — N939 Abnormal uterine and vaginal bleeding, unspecified: Secondary | ICD-10-CM

## 2023-09-11 LAB — CYTOLOGY - PAP
Comment: NEGATIVE
Diagnosis: NEGATIVE
High risk HPV: NEGATIVE

## 2023-09-12 ENCOUNTER — Encounter (HOSPITAL_BASED_OUTPATIENT_CLINIC_OR_DEPARTMENT_OTHER): Payer: Self-pay | Admitting: Obstetrics & Gynecology

## 2023-09-12 DIAGNOSIS — O4692 Antepartum hemorrhage, unspecified, second trimester: Secondary | ICD-10-CM | POA: Insufficient documentation

## 2023-09-12 NOTE — Progress Notes (Signed)
 History:   Jade Walters is a 38 y.o. G3P2002 at [redacted]w[redacted]d by LMP being seen today for her first obstetrical visit.  Dating determined by LMP and confirmed with 8 week ultrasound.  Her obstetrical history is significant for advanced maternal age. Patient does intend to breast feed. Pregnancy history fully reviewed.  Youngest son born with hearing impairment and wears hearing aids.    Patient reports concerns about vaginal bleeding x 2.  She has several days of bright red bleeding when she wiped last week.  Rested for several days.  Bleeding has stopped.  Did have some cramping.  Was seen at [facility].  No ultrasound done.  Placenta location unsure.  Was treated for BV with flagyl .  Doesn't seem to have made much difference.        HISTORY: OB History  Gravida Para Term Preterm AB Living  3 2 2  0 0 2  SAB IAB Ectopic Multiple Live Births  0 0 0 0 2    # Outcome Date GA Lbr Len/2nd Weight Sex Type Anes PTL Lv  3 Current           2 Term 12/19/17 [redacted]w[redacted]d 02:43 / 00:10 7 lb 11.6 oz (3.505 kg) M Vag-Spont EPI  LIV     Birth Comments: Congenital Hearing Impairment     Name: Jade Walters     Apgar1: 9  Apgar5: 9  1 Term 01/07/09 [redacted]w[redacted]d  8 lb 14 oz (4.026 kg) M Vag-Spont  N LIV     Name: Jade Walters    Last pap smear dating is unsure.  Will update today.  Past Medical History:  Diagnosis Date   Allergy    History reviewed. No pertinent surgical history. Family History  Problem Relation Age of Onset   Hypertension Father    Hyperlipidemia Father    Hearing loss Son    Diabetes Maternal Grandmother    Social History   Tobacco Use   Smoking status: Never   Smokeless tobacco: Never  Vaping Use   Vaping status: Never Used  Substance Use Topics   Alcohol use: No   Drug use: Never   No Known Allergies Current Outpatient Medications on File Prior to Visit  Medication Sig Dispense Refill   cyclobenzaprine  (FLEXERIL ) 10 MG tablet Take 1 tablet (10 mg total) by mouth 2 (two) times daily as needed for  muscle spasms. 20 tablet 0   Prenatal Vit-Fe Fumarate-FA (PRENATAL MULTIVITAMIN) TABS tablet Take 1 tablet by mouth daily at 12 noon.     No current facility-administered medications on file prior to visit.    Review of Systems Pertinent items noted in HPI and remainder of comprehensive ROS otherwise negative.  Physical Exam:   Vitals:   09/09/23 1455  BP: 121/86  Pulse: 82  Weight: 167 lb (75.8 kg)   Fetal Heart Rate (bpm): 145 Bedside ultrasound performed.  Good fluid.  Good FM.  Placenta is not low lying or a previa.  No funneling of cervix noted.   General: well-developed, well-nourished female in no acute distress  Breasts:  deferred  Skin: normal coloration and turgor, no rashes  Neurologic: oriented, normal, negative, normal mood  Extremities: normal strength, tone, and muscle mass, ROM of all joints is normal  HEENT PERRLA, extraocular movement intact and sclera clear, anicteric  Neck supple and no masses  Cardiovascular: regular rate and rhythm  Respiratory:  no respiratory distress, normal breath sounds  Abdomen: soft, non-tender; bowel sounds normal; no masses,  no organomegaly  Pelvic: normal external genitalia, no lesions, normal vaginal mucosa, normal vaginal discharge, normal cervix that does not appear dilated, pap smear done. Uterine size: 16 weeks     Assessment:    Pregnancy: [id number] Patient Active Problem List   Diagnosis Date Noted   Second trimester bleeding 09/12/2023   Antepartum multigravida of advanced maternal age 31/29/2025   Family history of congenital hearing loss 08/13/2023   Supervision of other normal pregnancy, antepartum 08/13/2023     Plan:    1. Supervision of other normal pregnancy, antepartum (Primary) - on PNV  2. [redacted] weeks gestation of pregnancy - declined AFP today  3. Family history of congenital hearing loss - in her second child  4. Vaginal bleeding - no bleeding noted on exam today and no specific source of bleeding  noted, cervix appears closed and does not appear shorted on exam - recommended pelvic rest and limiting pushing/pulling/tugging activities until no bleeding for several weeks - MBT is A+ - has 4 week follow up.  Offered to follow up sooner.  Pt wants to keep current appt but will let me know if has any future bleeding.    5. Cervical cancer screening - Cytology - PAP( [place])   Initial labs reviewee Continue prenatal vitamins. Problem list reviewed and updated. Genetic Screening discussed: declined. Ultrasound discussed; fetal anatomic survey: scheduled. Anticipatory guidance about prenatal visits given including labs, ultrasounds, and testing. Discussed usage of Babyscripts and virtual visits as additional source of managing and completing prenatal visits in midst of coronavirus and pandemic.   Encouraged to complete MyChart Registration for her ability to review results, send requests, and have questions addressed.  The nature of [facility] with multiple MDs and Advanced Practice Providers was explained to patient; also emphasized that residents, students are part of our team. Routine obstetric precautions reviewed. Encouraged to seek out care at office or emergency room [facility] preferred) for urgent and/or emergent concerns. Return in about 4 weeks (around 10/07/2023) for advised pt to call with any additional bleeding.     EMERSON Elvie Pinal, MD, FACOG Obstetrician & Gynecologist, [facility] for [facility], [facility], [facility]

## 2023-09-24 ENCOUNTER — Ambulatory Visit (HOSPITAL_BASED_OUTPATIENT_CLINIC_OR_DEPARTMENT_OTHER): Payer: Self-pay | Admitting: Obstetrics & Gynecology

## 2023-10-01 ENCOUNTER — Ambulatory Visit: Payer: Self-pay | Attending: Obstetrics and Gynecology | Admitting: Obstetrics and Gynecology

## 2023-10-01 ENCOUNTER — Ambulatory Visit (HOSPITAL_BASED_OUTPATIENT_CLINIC_OR_DEPARTMENT_OTHER): Payer: Self-pay

## 2023-10-01 ENCOUNTER — Other Ambulatory Visit: Payer: Self-pay | Admitting: *Deleted

## 2023-10-01 VITALS — BP 116/78

## 2023-10-01 DIAGNOSIS — Z363 Encounter for antenatal screening for malformations: Secondary | ICD-10-CM | POA: Insufficient documentation

## 2023-10-01 DIAGNOSIS — O352XX0 Maternal care for (suspected) hereditary disease in fetus, not applicable or unspecified: Secondary | ICD-10-CM

## 2023-10-01 DIAGNOSIS — Z822 Family history of deafness and hearing loss: Secondary | ICD-10-CM

## 2023-10-01 DIAGNOSIS — O09522 Supervision of elderly multigravida, second trimester: Secondary | ICD-10-CM | POA: Diagnosis not present

## 2023-10-01 DIAGNOSIS — Z3A19 19 weeks gestation of pregnancy: Secondary | ICD-10-CM | POA: Diagnosis not present

## 2023-10-01 DIAGNOSIS — O358XX0 Maternal care for other (suspected) fetal abnormality and damage, not applicable or unspecified: Secondary | ICD-10-CM

## 2023-10-01 DIAGNOSIS — O09529 Supervision of elderly multigravida, unspecified trimester: Secondary | ICD-10-CM

## 2023-10-01 NOTE — Progress Notes (Signed)
 Maternal-Fetal Medicine Consultation  Name: Jade Walters  MRN: [id number]  GA: [id number] 110w2d   Patient is here for fetal anatomy scan. Advanced maternal age.  Patient had opted not to screen for fetal aneuploidies. Obstetric history significant for 2 term vaginal deliveries.  Her second son has congenital hearing loss.  No syndrome was diagnosed.  Ultrasound We performed fetal anatomy scan. No makers of aneuploidies or fetal structural defects are seen. Fetal biometry is consistent with her previously-established dates. Amniotic fluid is normal and good fetal activity is seen. Patient understands the limitations of ultrasound in detecting fetal anomalies.  Advanced maternal age (AMA) I informed that AMA is associated with an increase in fetal aneuploidies.  I discussed the significance and limitations of cell-free fetal DNA screening that has a greater detection rate for Down syndrome.  However, it does not detect all chromosomal anomalies. I explained that only amniocentesis will give a definitive result on the fetal karyotype and some genetic conditions (microarray).  Amniocentesis is associated with a small risk of miscarriage (1 and 500 procedures). Patient opted not to have amniocentesis.    Recommendations - Appointment was made for her to return in 12 weeks for fetal growth assessment (AMA).     Consultation including face-to-face (more than 50%) counseling 20 minutes.

## 2023-10-07 ENCOUNTER — Ambulatory Visit (INDEPENDENT_AMBULATORY_CARE_PROVIDER_SITE_OTHER): Payer: Self-pay | Admitting: Certified Nurse Midwife

## 2023-10-07 VITALS — BP 112/59 | HR 79 | Wt 164.8 lb

## 2023-10-07 DIAGNOSIS — Z3A20 20 weeks gestation of pregnancy: Secondary | ICD-10-CM

## 2023-10-07 DIAGNOSIS — Z3480 Encounter for supervision of other normal pregnancy, unspecified trimester: Secondary | ICD-10-CM

## 2023-10-07 DIAGNOSIS — Z3482 Encounter for supervision of other normal pregnancy, second trimester: Secondary | ICD-10-CM | POA: Diagnosis not present

## 2023-10-08 LAB — AFP, SERUM, OPEN SPINA BIFIDA
AFP MoM: 1.59
AFP Value: 86 ng/mL
Gest. Age on Collection Date: 20.1 wk
Maternal Age At EDD: 36.6 a
OSBR Risk 1 IN: 2155
Test Results:: NEGATIVE
Weight: 164 [lb_av]

## 2023-10-09 ENCOUNTER — Ambulatory Visit (HOSPITAL_BASED_OUTPATIENT_CLINIC_OR_DEPARTMENT_OTHER): Payer: Self-pay | Admitting: Certified Nurse Midwife

## 2023-10-10 NOTE — Progress Notes (Signed)
   PRENATAL VISIT NOTE  Subjective:  Jade Walters is a 38 y.o. G3P2002 at [redacted]w[redacted]d being seen today for ongoing prenatal care.  She is currently monitored for the following issues for this low-risk pregnancy and has Antepartum multigravida of advanced maternal age; Family history of congenital hearing loss; Supervision of other normal pregnancy, antepartum; and Second trimester bleeding on their problem list.  Patient reports no complaints.  Contractions: Not present. Vag. Bleeding: None.  Movement: Present. Denies leaking of fluid.   The following portions of the patient's history were reviewed and updated as appropriate: allergies, current medications, past family history, past medical history, past social history, past surgical history and problem list.   Objective:    Vitals:   10/07/23 1450  BP: (!) 112/59  Pulse: 79  Weight: 164 lb 12.8 oz (74.8 kg)    Fetal Status:  Fetal Heart Rate (bpm): 140   Movement: Present    General: Alert, oriented and cooperative. Patient is in no acute distress.  Skin: Skin is warm and dry. No rash noted.   Cardiovascular: Normal heart rate noted  Respiratory: Normal respiratory effort, no problems with respiration noted  Abdomen: Soft, gravid, appropriate for gestational age.  Pain/Pressure: Absent     Pelvic: Cervical exam deferred        Extremities: Normal range of motion.  Edema: Trace (Feet)  Mental Status: Normal mood and affect. Normal behavior. Normal judgment and thought content.   Assessment and Plan:  Pregnancy: G3P2002 at [redacted]w[redacted]d  1. Supervision of other normal pregnancy, antepartum (Primary) - AFP, Serum, Open Spina Bifida  2. [redacted] weeks gestation of pregnancy  3. Family hx Congenital Hearing Loss - in her 2nd child   Preterm labor symptoms and general obstetric precautions including but not limited to vaginal bleeding, contractions, leaking of fluid and fetal movement were reviewed in detail with the patient. Please refer to  After Visit Summary for other counseling recommendations.   No follow-ups on file.  Future Appointments  Date Time Provider Department Center  11/04/2023  1:55 PM Tad Arland POUR, CNM [facility] [address]  12/03/2023  8:30 AM [facility]YN LAB [facility] [address]  12/03/2023  8:55 AM Cleotilde Ronal RAMAN, MD [facility] [address]  12/24/2023 10:15 AM [facility] PROVIDER 1 [facility]  12/24/2023 10:30 AM [facility] US4 [facility]  12/31/2023 10:55 AM Shalaunda Weatherholtz, Arland POUR, CNM [facility] [address]  01/14/2024 10:55 AM Antonique Langford, Arland POUR, CNM [facility] [address]  01/28/2024  8:35 AM Kelci Petrella, Arland POUR, CNM [facility] [address]  02/04/2024  8:15 AM Kadeidra Coryell, Arland POUR, CNM [facility] [address]  02/11/2024  8:55 AM Cleotilde Ronal RAMAN, MD [facility] [address]  02/18/2024  8:15 AM Cleotilde Ronal RAMAN, MD [facility] [phone] [address]    Arland POUR Tad, CNM

## 2023-11-04 ENCOUNTER — Ambulatory Visit (HOSPITAL_BASED_OUTPATIENT_CLINIC_OR_DEPARTMENT_OTHER): Payer: Self-pay | Admitting: Certified Nurse Midwife

## 2023-11-04 VITALS — BP 121/76 | HR 91 | Wt 172.0 lb

## 2023-11-04 DIAGNOSIS — Z822 Family history of deafness and hearing loss: Secondary | ICD-10-CM | POA: Diagnosis not present

## 2023-11-04 DIAGNOSIS — Z3A24 24 weeks gestation of pregnancy: Secondary | ICD-10-CM | POA: Diagnosis not present

## 2023-11-04 DIAGNOSIS — Z3480 Encounter for supervision of other normal pregnancy, unspecified trimester: Secondary | ICD-10-CM

## 2023-11-04 DIAGNOSIS — Z3482 Encounter for supervision of other normal pregnancy, second trimester: Secondary | ICD-10-CM | POA: Diagnosis not present

## 2023-11-04 NOTE — Progress Notes (Signed)
 Patient reports pain and pressure on left side of pelvic bone

## 2023-11-04 NOTE — Progress Notes (Signed)
   PRENATAL VISIT NOTE  Subjective:  Jade Walters is a 38 y.o. G3P2002 at [redacted]w[redacted]d being seen today for ongoing prenatal care.  She is currently monitored for the following issues for this low-risk pregnancy and has Antepartum multigravida of advanced maternal age; Family history of congenital hearing loss; Supervision of other normal pregnancy, antepartum; and Second trimester bleeding on their problem list.  Patient reports has intermittent pain over symphysis pubis and left broad ligament pain. No vaginal spotting or bleeding. No cramping, contractions or LOF from vagina.  Contractions: Not present. Vag. Bleeding: None.  Movement: Present. Denies leaking of fluid.   The following portions of the patient's history were reviewed and updated as appropriate: allergies, current medications, past family history, past medical history, past social history, past surgical history and problem list.   Objective:    Vitals:   11/04/23 1348  BP: 121/76  Pulse: 91  Weight: 172 lb (78 kg)   Fetal Status:      Movement: Present    General: Alert, oriented and cooperative. Patient is in no acute distress.  Skin: Skin is warm and dry. No rash noted.   Cardiovascular: Normal heart rate noted  Respiratory: Normal respiratory effort, no problems with respiration noted  Abdomen: Soft, gravid, appropriate for gestational age.  Pain/Pressure: Present (patient reports pain in left pelvic area)     Pelvic: Cervical exam deferred        Extremities: Normal range of motion.  Edema: None  Mental Status: Normal mood and affect. Normal behavior. Normal judgment and thought content.   Assessment and Plan:  Pregnancy: G3P2002 at [redacted]w[redacted]d  1. Supervision of other normal pregnancy, antepartum (Primary) - Taking PNV - Discussed Fasting 2hr GTT at next prenatal visit  2.  Family hx Congenital Hearing Loss - in her 2nd child   Preterm labor symptoms and general obstetric precautions including but not limited to  vaginal bleeding, contractions, leaking of fluid and fetal movement were reviewed in detail with the patient. Please refer to After Visit Summary for other counseling recommendations.   No follow-ups on file.  Future Appointments  Date Time Provider Department Center  11/19/2023  8:30 AM [facility] [address]  11/19/2023  8:55 AM Cleotilde Ronal RAMAN, MD [facility] [address]  12/17/2023  1:55 PM Tad, Arland POUR, CNM [facility] [address]  12/24/2023 10:15 AM [facility] PROVIDER 1 [facility]  12/24/2023 10:30 AM [facility] US4 [facility]  01/14/2024 10:55 AM Bayne Fosnaugh, Arland POUR, CNM [facility] [address]  01/28/2024  8:35 AM Charna Neeb, Arland POUR, CNM [facility] [address]  02/04/2024  8:15 AM Jandi Swiger, Arland POUR, CNM [facility] [address]  02/11/2024  8:55 AM Cleotilde Ronal RAMAN, MD [facility] [address]  02/18/2024  8:15 AM Cleotilde Ronal RAMAN, MD [facility] [phone] [address]    Arland POUR Tad, CNM

## 2023-11-19 ENCOUNTER — Encounter (HOSPITAL_BASED_OUTPATIENT_CLINIC_OR_DEPARTMENT_OTHER): Payer: Self-pay | Admitting: Obstetrics & Gynecology

## 2023-11-19 ENCOUNTER — Other Ambulatory Visit (HOSPITAL_BASED_OUTPATIENT_CLINIC_OR_DEPARTMENT_OTHER)

## 2023-11-19 ENCOUNTER — Ambulatory Visit (INDEPENDENT_AMBULATORY_CARE_PROVIDER_SITE_OTHER): Admitting: Obstetrics & Gynecology

## 2023-11-19 VITALS — BP 121/79 | HR 88 | Wt 174.0 lb

## 2023-11-19 DIAGNOSIS — Z1331 Encounter for screening for depression: Secondary | ICD-10-CM | POA: Diagnosis not present

## 2023-11-19 DIAGNOSIS — Z3A26 26 weeks gestation of pregnancy: Secondary | ICD-10-CM | POA: Diagnosis not present

## 2023-11-19 DIAGNOSIS — O09529 Supervision of elderly multigravida, unspecified trimester: Secondary | ICD-10-CM

## 2023-11-19 DIAGNOSIS — O09522 Supervision of elderly multigravida, second trimester: Secondary | ICD-10-CM | POA: Diagnosis not present

## 2023-11-19 DIAGNOSIS — Z822 Family history of deafness and hearing loss: Secondary | ICD-10-CM

## 2023-11-19 DIAGNOSIS — Z3480 Encounter for supervision of other normal pregnancy, unspecified trimester: Secondary | ICD-10-CM

## 2023-11-19 NOTE — Progress Notes (Signed)
   PRENATAL VISIT NOTE  Subjective:  Jade Walters is a 38 y.o. G3P2002 at [redacted]w[redacted]d being seen today for ongoing prenatal care.  She is currently monitored for the following issues for this low-risk pregnancy and has Antepartum multigravida of advanced maternal age; Family history of congenital hearing loss; Supervision of other normal pregnancy, antepartum; and Second trimester bleeding on their problem list.  Patient reports no complaints.  Contractions: Not present. Vag. Bleeding: None.  Movement: Present. Denies leaking of fluid.   The following portions of the patient's history were reviewed and updated as appropriate: allergies, current medications, past family history, past medical history, past social history, past surgical history and problem list.   Objective:   Vitals:   11/19/23 0852  BP: 121/79  Pulse: 88  Weight: 174 lb (78.9 kg)    Fetal Status: Fetal Heart Rate (bpm): 141 Fundal Height: 27 cm  Movement: Present    General:  Alert, oriented and cooperative. Patient is in no acute distress.  Skin: Skin is warm and dry. No rash noted.   Cardiovascular: Normal heart rate noted  Respiratory: Normal respiratory effort, no problems with respiration noted  Abdomen: Soft, gravid, appropriate for gestational age.  Pain/Pressure: Absent     Pelvic: Cervical exam deferred        Extremities: Normal range of motion.  Edema: None  Mental Status: Normal mood and affect. Normal behavior. Normal judgment and thought content.   Assessment and Plan:  Pregnancy: G3P2002 at [redacted]w[redacted]d 1. Supervision of other normal pregnancy, antepartum (Primary) - on PNV - declined Tdap today - GTT, CBC, HIV and RPR obtained today - pt not entered blood pressures in baby scripts so baby scripts info given to her today - Babyscripts Schedule Optimization  2. [redacted] weeks gestation of pregnancy - recheck 4 weeks  3. Antepartum multigravida of advanced maternal age - growth scan scheduled for early  December  4. Family history of congenital hearing loss  Preterm labor symptoms and general obstetric precautions including but not limited to vaginal bleeding, contractions, leaking of fluid and fetal movement were reviewed in detail with the patient. Please refer to After Visit Summary for other counseling recommendations.   Return in about 4 weeks (around 12/17/2023).  Future Appointments  Date Time Provider Department Center  12/19/2023 10:35 AM Delores Nidia CROME, FNP [facility] [address]  12/24/2023 10:15 AM [facility] PROVIDER 1 [facility]  12/24/2023 10:30 AM [facility] US4 [facility]  01/14/2024 10:55 AM Lo, Arland POUR, CNM [facility] [address]  01/28/2024  8:35 AM Lo, Arland POUR, CNM [facility] [address]  02/04/2024  8:15 AM Lo, Arland POUR, CNM [facility] [address]  02/11/2024  8:55 AM Cleotilde Ronal RAMAN, MD [facility] [address]  02/18/2024  8:15 AM Cleotilde Ronal RAMAN, MD [facility] [phone] [address]    Ronal RAMAN Cleotilde, MD

## 2023-11-20 LAB — HIV ANTIBODY (ROUTINE TESTING W REFLEX): HIV Screen 4th Generation wRfx: NONREACTIVE

## 2023-11-20 LAB — CBC
Hematocrit: 35.8 % (ref 34.0–46.6)
Hemoglobin: 12 g/dL (ref 11.1–15.9)
MCH: 32.1 pg (ref 26.6–33.0)
MCHC: 33.5 g/dL (ref 31.5–35.7)
MCV: 96 fL (ref 79–97)
Platelets: 292 x10E3/uL (ref 150–450)
RBC: 3.74 x10E6/uL — ABNORMAL LOW (ref 3.77–5.28)
RDW: 12 % (ref 11.7–15.4)
WBC: 9.2 x10E3/uL (ref 3.4–10.8)

## 2023-11-20 LAB — GLUCOSE TOLERANCE, 2 HOURS W/ 1HR
Glucose, 1 hour: 164 mg/dL (ref 70–179)
Glucose, 2 hour: 92 mg/dL (ref 70–152)
Glucose, Fasting: 80 mg/dL (ref 70–91)

## 2023-11-20 LAB — RPR: RPR Ser Ql: NONREACTIVE

## 2023-11-24 ENCOUNTER — Ambulatory Visit (HOSPITAL_BASED_OUTPATIENT_CLINIC_OR_DEPARTMENT_OTHER): Payer: Self-pay | Admitting: Obstetrics & Gynecology

## 2023-11-24 DIAGNOSIS — Z3480 Encounter for supervision of other normal pregnancy, unspecified trimester: Secondary | ICD-10-CM

## 2023-12-03 ENCOUNTER — Encounter (HOSPITAL_BASED_OUTPATIENT_CLINIC_OR_DEPARTMENT_OTHER): Payer: Self-pay | Admitting: Certified Nurse Midwife

## 2023-12-03 ENCOUNTER — Other Ambulatory Visit (HOSPITAL_BASED_OUTPATIENT_CLINIC_OR_DEPARTMENT_OTHER): Payer: Self-pay

## 2023-12-17 ENCOUNTER — Encounter (HOSPITAL_BASED_OUTPATIENT_CLINIC_OR_DEPARTMENT_OTHER): Admitting: Certified Nurse Midwife

## 2023-12-19 ENCOUNTER — Encounter (HOSPITAL_BASED_OUTPATIENT_CLINIC_OR_DEPARTMENT_OTHER): Admitting: Obstetrics and Gynecology

## 2023-12-19 ENCOUNTER — Ambulatory Visit (HOSPITAL_BASED_OUTPATIENT_CLINIC_OR_DEPARTMENT_OTHER): Admitting: Obstetrics and Gynecology

## 2023-12-19 VITALS — BP 114/82 | HR 96 | Wt 179.2 lb

## 2023-12-19 DIAGNOSIS — O09529 Supervision of elderly multigravida, unspecified trimester: Secondary | ICD-10-CM

## 2023-12-19 DIAGNOSIS — Z3A30 30 weeks gestation of pregnancy: Secondary | ICD-10-CM

## 2023-12-19 DIAGNOSIS — O09523 Supervision of elderly multigravida, third trimester: Secondary | ICD-10-CM

## 2023-12-19 DIAGNOSIS — Z822 Family history of deafness and hearing loss: Secondary | ICD-10-CM | POA: Diagnosis not present

## 2023-12-19 DIAGNOSIS — Z3480 Encounter for supervision of other normal pregnancy, unspecified trimester: Secondary | ICD-10-CM

## 2023-12-19 NOTE — Progress Notes (Signed)
   PRENATAL VISIT NOTE  Subjective:  Jade Walters is a 38 y.o. G3P2002 at [redacted]w[redacted]d being seen today for ongoing prenatal care.  She is currently monitored for the following issues for this low-risk pregnancy and has Antepartum multigravida of advanced maternal age; Family history of congenital hearing loss; Supervision of other normal pregnancy, antepartum; and Second trimester bleeding on their problem list.  Patient reports no complaints.  Contractions: Not present. Vag. Bleeding: None.  Movement: Present. Denies leaking of fluid.   The following portions of the patient's history were reviewed and updated as appropriate: allergies, current medications, past family history, past medical history, past social history, past surgical history and problem list.   Objective:   Vitals:   12/19/23 1035  BP: 114/82  Pulse: 96  Weight: 179 lb 3.2 oz (81.3 kg)    Fetal Status:  Fetal Heart Rate (bpm): 140 Fundal Height: 30 cm Movement: Present    General: Alert, oriented and cooperative. Patient is in no acute distress.  Skin: Skin is warm and dry. No rash noted.   Cardiovascular: Normal heart rate noted  Respiratory: Normal respiratory effort, no problems with respiration noted  Abdomen: Soft, gravid, appropriate for gestational age.  Pain/Pressure: Absent     Pelvic: Cervical exam deferred        Extremities: Normal range of motion.  Edema: None  Mental Status: Normal mood and affect. Normal behavior. Normal judgment and thought content.   Assessment and Plan:  Pregnancy: G3P2002 at [redacted]w[redacted]d 1. Supervision of other normal pregnancy, antepartum (Primary) BP and FHR normal Doing well, feeling regular movement    2. [redacted] weeks gestation of pregnancy No bleeding currently   3. Antepartum multigravida of advanced maternal age Growth u/s 12/9  4. Family history of congenital hearing loss   Preterm labor symptoms and general obstetric precautions including but not limited to vaginal bleeding,  contractions, leaking of fluid and fetal movement were reviewed in detail with the patient. Please refer to After Visit Summary for other counseling recommendations.    Future Appointments  Date Time Provider Department Center  12/24/2023 10:15 AM [facility] PROVIDER 1 [facility]  12/24/2023 10:30 AM [facility] US4 [facility]  01/13/2024 10:55 AM Cleotilde Ronal RAMAN, MD [facility] [address]  01/28/2024  8:35 AM Delores Nidia CROME, FNP [facility] [address]  02/04/2024  8:15 AM Delores Nidia CROME, FNP [facility] [address]  02/11/2024  8:55 AM Delores Nidia CROME, FNP [facility] [address]  02/18/2024  8:15 AM Delores Nidia CROME, FNP [facility] [phone] [address]    Nidia Delores, FNP

## 2023-12-19 NOTE — Progress Notes (Signed)
[phone] 

## 2023-12-24 ENCOUNTER — Ambulatory Visit (HOSPITAL_BASED_OUTPATIENT_CLINIC_OR_DEPARTMENT_OTHER): Admitting: Obstetrics

## 2023-12-24 ENCOUNTER — Encounter (HOSPITAL_BASED_OUTPATIENT_CLINIC_OR_DEPARTMENT_OTHER): Admitting: Certified Nurse Midwife

## 2023-12-24 ENCOUNTER — Ambulatory Visit: Attending: Obstetrics and Gynecology

## 2023-12-24 VITALS — BP 128/75

## 2023-12-24 DIAGNOSIS — O358XX0 Maternal care for other (suspected) fetal abnormality and damage, not applicable or unspecified: Secondary | ICD-10-CM

## 2023-12-24 DIAGNOSIS — O09522 Supervision of elderly multigravida, second trimester: Secondary | ICD-10-CM

## 2023-12-24 DIAGNOSIS — Z3A31 31 weeks gestation of pregnancy: Secondary | ICD-10-CM

## 2023-12-24 DIAGNOSIS — O09523 Supervision of elderly multigravida, third trimester: Secondary | ICD-10-CM

## 2023-12-24 NOTE — Progress Notes (Signed)
 MFM Consult Note  Jade Walters is currently at [redacted]w[redacted]d. She has been followed due to advanced maternal age (38 years old).    She denies any problems since her last exam and has screened negative for gestational diabetes.    Her blood pressure today was 128/75.  Sonographic findings Single intrauterine pregnancy at 31w 2d.  Fetal cardiac activity:  Observed and appears normal. Presentation: Cephalic. Fetal biometry shows the estimated fetal weight of 4 lb 3 oz,  1890g (64%). Amniotic fluid volume: Within normal limits. AFI: 16.1cm.  MVP: 6.13 cm. Placenta: Posterior.  As the fetal growth is within normal limits, no further exams were scheduled in our office.  She should continue routine prenatal care.    The patient stated that all of her questions were answered.

## 2023-12-31 ENCOUNTER — Encounter (HOSPITAL_BASED_OUTPATIENT_CLINIC_OR_DEPARTMENT_OTHER): Admitting: Certified Nurse Midwife

## 2024-01-07 ENCOUNTER — Encounter (HOSPITAL_BASED_OUTPATIENT_CLINIC_OR_DEPARTMENT_OTHER): Admitting: Certified Nurse Midwife

## 2024-01-11 ENCOUNTER — Inpatient Hospital Stay (HOSPITAL_COMMUNITY)

## 2024-01-11 ENCOUNTER — Encounter (HOSPITAL_COMMUNITY): Payer: Self-pay | Admitting: Obstetrics & Gynecology

## 2024-01-11 ENCOUNTER — Inpatient Hospital Stay (HOSPITAL_COMMUNITY)
Admission: AD | Admit: 2024-01-11 | Discharge: 2024-01-11 | Disposition: A | Discharge status: Home / Self Care | Attending: Obstetrics & Gynecology | Admitting: Obstetrics & Gynecology

## 2024-01-11 DIAGNOSIS — Z3A33 33 weeks gestation of pregnancy: Secondary | ICD-10-CM

## 2024-01-11 DIAGNOSIS — N930 Postcoital and contact bleeding: Secondary | ICD-10-CM

## 2024-01-11 DIAGNOSIS — O09523 Supervision of elderly multigravida, third trimester: Secondary | ICD-10-CM | POA: Diagnosis not present

## 2024-01-11 DIAGNOSIS — O4693 Antepartum hemorrhage, unspecified, third trimester: Secondary | ICD-10-CM | POA: Diagnosis not present

## 2024-01-11 DIAGNOSIS — Z3689 Encounter for other specified antenatal screening: Secondary | ICD-10-CM

## 2024-01-11 DIAGNOSIS — O352XX0 Maternal care for (suspected) hereditary disease in fetus, not applicable or unspecified: Secondary | ICD-10-CM

## 2024-01-11 LAB — URINALYSIS, ROUTINE W REFLEX MICROSCOPIC
Bacteria, UA: NONE SEEN
Bilirubin Urine: NEGATIVE
Glucose, UA: 50 mg/dL — AB
Ketones, ur: NEGATIVE mg/dL
Leukocytes,Ua: NEGATIVE
Nitrite: NEGATIVE
Protein, ur: 30 mg/dL — AB
Specific Gravity, Urine: 1.025 (ref 1.005–1.030)
pH: 5 (ref 5.0–8.0)

## 2024-01-11 LAB — WET PREP, GENITAL
Clue Cells Wet Prep HPF POC: NONE SEEN
Sperm: NONE SEEN
Trich, Wet Prep: NONE SEEN
WBC, Wet Prep HPF POC: <10
Yeast Wet Prep HPF POC: NONE SEEN

## 2024-01-11 NOTE — MAU Provider Note (Signed)
 Chief Complaint:  Vaginal Bleeding and Abdominal Pain   HPI   Event Date/Time   First Provider Initiated Contact with Patient 01/11/24 1350      Jade Walters is a 38 y.o. G3P2002 at [redacted]w[redacted]d who presents to maternity admissions reporting an episode of vaginal bleeding this morning around 1030am. Points to the 'moderate' bleeding picture in the room. Did have sex around 7am this morning. Once there was less blood, she got in the shower. Afterwards, she noticed a blood mucus. Also notes irregular contractions throughout the night yesterday. Denies any contractions, vaginal bleeding, leakage of fluid, or decreased fetal movement.  Pregnancy Course: Follows with [facility]. Has had an episode of bleeding in the second trimester in August.   Past Medical History:  Diagnosis Date   Allergy    Seasonal   OB History  Gravida Para Term Preterm AB Living  3 2 2  0 0 2  SAB IAB Ectopic Multiple Live Births  0 0 0 0 2    # Outcome Date GA Lbr Len/2nd Weight Sex Type Anes PTL Lv  3 Current           2 Term 12/19/17 [redacted]w[redacted]d 02:43 / 00:10 3505 g M Vag-Spont EPI  LIV     Birth Comments: Congenital Hearing Impairment  1 Term 01/07/09 [redacted]w[redacted]d  4026 g M Vag-Spont  N LIV   History reviewed. No pertinent surgical history. Family History  Problem Relation Age of Onset   Hypertension Father    Hyperlipidemia Father    Hearing loss Son    Diabetes Maternal Grandmother    Cancer Maternal Grandfather    Asthma Neg Hx    Social History[1] Allergies[2] Medications Prior to Admission  Medication Sig Dispense Refill Last Dose/Taking   Prenatal Vit-Fe Fumarate-FA (PRENATAL MULTIVITAMIN) TABS tablet Take 1 tablet by mouth daily at 12 noon.   01/10/2024    I have reviewed patient's Past Medical Hx, Surgical Hx, Family Hx, Social Hx, medications and allergies.   ROS  Pertinent items noted in HPI and remainder of comprehensive ROS otherwise negative.   PHYSICAL EXAM  Patient Vitals for the past 24  hrs:  BP Temp Pulse Resp Height Weight  01/11/24 1250 122/71 98.3 F (36.8 C) (!) 106 18 5' 5 (1.651 m) 83.5 kg    Constitutional: Well-developed, well-nourished female in no acute distress.  Cardiovascular: normal rate & rhythm, warm and well-perfused Respiratory: normal effort, no problems with respiration noted GI: Abd soft, non-tender, non-distended MS: Extremities nontender, no edema, normal ROM Neurologic: Alert and oriented x 4.  GU: no CVA tenderness Pelvic: normal external female genitalia, physiologic discharge, blood mixed with mucus at cervical os, visibly closed     Fetal Tracing: Baseline: 130bpm Variability: Moderate Accelerations: Present Decelerations: Absent Toco: No ctx notes   Labs: Results for orders placed or performed during the hospital encounter of 01/11/24 (from the past 24 hours)  Urinalysis, Routine w reflex microscopic -Urine, Clean Catch     Status: Abnormal   Collection Time: 01/11/24 12:53 PM  Result Value Ref Range   Color, Urine YELLOW YELLOW   APPearance HAZY (A) CLEAR   Specific Gravity, Urine 1.025 1.005 - 1.030   pH 5.0 5.0 - 8.0   Glucose, UA 50 (A) NEGATIVE mg/dL   Hgb urine dipstick MODERATE (A) NEGATIVE   Bilirubin Urine NEGATIVE NEGATIVE   Ketones, ur NEGATIVE NEGATIVE mg/dL   Protein, ur 30 (A) NEGATIVE mg/dL   Nitrite NEGATIVE NEGATIVE   Leukocytes,Ua NEGATIVE  NEGATIVE   RBC / HPF 0-5 0 - 5 RBC/hpf   WBC, UA 0-5 0 - 5 WBC/hpf   Bacteria, UA NONE SEEN NONE SEEN   Squamous Epithelial / HPF 0-5 0 - 5 /HPF   Mucus PRESENT   Wet prep, genital     Status: None   Collection Time: 01/11/24  2:18 PM   Specimen: Vaginal  Result Value Ref Range   Yeast Wet Prep HPF POC NONE SEEN NONE SEEN   Trich, Wet Prep NONE SEEN NONE SEEN   Clue Cells Wet Prep HPF POC NONE SEEN NONE SEEN   WBC, Wet Prep HPF POC <10 <10   Sperm NONE SEEN     Imaging:  No results found.  MDM & MAU COURSE  MDM: Moderate  MAU Course: Orders Placed This  Encounter  Procedures   Wet prep, genital   US  MFM OB LIMITED   Urinalysis, Routine w reflex microscopic -Urine, Clean Catch   Discharge patient   No orders of the defined types were placed in this encounter.  VSS. Speculum exam showing thin streak of blood mixed with mucus, no active bleeding or pooling present. Swabs collected. Will send for ultrasound.  315p - Wet prep and UA negative. Ultrasound negative for placenta previa or abruption. Discussed results with patient. Plan for pelvic rest for 1 week. All questions answered prior to discharge.  ASSESSMENT   1. [redacted] weeks gestation of pregnancy   2. NST (non-stress test) reactive   3. Postcoital bleeding     PLAN  Discharge home in stable condition with return precautions.  Follow up with OB as scheduled.    Allergies as of 01/11/2024   No Known Allergies      Medication List     TAKE these medications    prenatal multivitamin Tabs tablet Take 1 tablet by mouth daily at 12 noon.        Charlie Courts, MD  Family Medicine - Obstetrics Fellow        [1]  Social History Tobacco Use   Smoking status: Never   Smokeless tobacco: Never  Vaping Use   Vaping status: Never Used  Substance Use Topics   Alcohol use: No   Drug use: Never  [2] No Known Allergies

## 2024-01-11 NOTE — MAU Note (Signed)
 Jade Walters is a 38 y.o. at [redacted]w[redacted]d here in MAU reporting: had some cramping yesterday and it went away. Today her stomach feels hard and crampy and had some bleeding when she wiped. Admits to intercourse this morning and the pain and bleeding started after. Just wants to make sure everything is ok. Reports good fetal movement.  LMP:  Onset of complaint: today Pain score: 8 Vitals:   01/11/24 1250  BP: 122/71  Pulse: (!) 106  Resp: 18  Temp: 98.3 F (36.8 C)     FHT: 148  Lab orders placed from triage: u/a

## 2024-01-11 NOTE — Discharge Instructions (Signed)
 You came into the hospital because you had bleeding after sex. We did swabs and an ultrasound which were all normal. Plan for pelvic rest for a week. Please follow up with your OB as scheduled.

## 2024-01-13 ENCOUNTER — Encounter (HOSPITAL_BASED_OUTPATIENT_CLINIC_OR_DEPARTMENT_OTHER): Payer: Self-pay | Admitting: Obstetrics & Gynecology

## 2024-01-13 ENCOUNTER — Ambulatory Visit (HOSPITAL_BASED_OUTPATIENT_CLINIC_OR_DEPARTMENT_OTHER): Admitting: Obstetrics & Gynecology

## 2024-01-13 VITALS — BP 126/88 | HR 106 | Wt 183.4 lb

## 2024-01-13 DIAGNOSIS — O09523 Supervision of elderly multigravida, third trimester: Secondary | ICD-10-CM

## 2024-01-13 DIAGNOSIS — Z3A34 34 weeks gestation of pregnancy: Secondary | ICD-10-CM | POA: Diagnosis not present

## 2024-01-13 DIAGNOSIS — O0993 Supervision of high risk pregnancy, unspecified, third trimester: Secondary | ICD-10-CM

## 2024-01-13 DIAGNOSIS — O09529 Supervision of elderly multigravida, unspecified trimester: Secondary | ICD-10-CM

## 2024-01-13 DIAGNOSIS — O09893 Supervision of other high risk pregnancies, third trimester: Secondary | ICD-10-CM

## 2024-01-13 DIAGNOSIS — Z822 Family history of deafness and hearing loss: Secondary | ICD-10-CM

## 2024-01-13 LAB — GC/CHLAMYDIA PROBE AMP (~~LOC~~) NOT AT ARMC
Chlamydia: NEGATIVE
Comment: NEGATIVE
Comment: NORMAL
Neisseria Gonorrhea: NEGATIVE

## 2024-01-13 NOTE — Progress Notes (Signed)
" ° °  PRENATAL VISIT NOTE  Subjective:  Jade Walters is a 38 y.o. G3P2002 at [redacted]w[redacted]d being seen today for ongoing prenatal care.  She is currently monitored for the following issues for this low-risk pregnancy and has Antepartum multigravida of advanced maternal age; Family history of congenital hearing loss; Supervision of other normal pregnancy, antepartum; and Second trimester bleeding on their problem list.  Patient reports some bleeding after intercourse.  Did go to the MAU.  Ultrasound was reassuring.  Fetal heart tracing was reassuring.  Vaginal swab testing negative.  Bleeding has stopped.  She is having some reflux.  Not taking anything.  OTC options discussed.   Contractions: Irregular. Vag. Bleeding: None.  Movement: Present. Denies leaking of fluid.   The following portions of the patient's history were reviewed and updated as appropriate: allergies, current medications, past family history, past medical history, past social history, past surgical history and problem list.   Objective:   Vitals:   01/13/24 1052  BP: 126/88  Pulse: (!) 106  Weight: 183 lb 6.4 oz (83.2 kg)    Fetal Status: Fetal Heart Rate (bpm): 132 Fundal Height: 34 cm  Movement: Present Presentation: Vertex  General:  Alert, oriented and cooperative. Patient is in no acute distress.  Skin: Skin is warm and dry. No rash noted.   Cardiovascular: Normal heart rate noted  Respiratory: Normal respiratory effort, no problems with respiration noted  Abdomen: Soft, gravid, appropriate for gestational age.  Pain/Pressure: Present (pelvic pressure)     Pelvic: Cervical exam deferred        Extremities: Normal range of motion.  Edema: None  Mental Status: Normal mood and affect. Normal behavior. Normal judgment and thought content.   Assessment and Plan:  Pregnancy: G3P2002 at [redacted]w[redacted]d 1. Supervision of high risk pregnancy in third trimester (Primary) - on PNV - recheck 2 weeks  2. [redacted] weeks gestation of  pregnancy  3. Antepartum multigravida of advanced maternal age  48. Family history of congenital hearing loss   Preterm labor symptoms and general obstetric precautions including but not limited to vaginal bleeding, contractions, leaking of fluid and fetal movement were reviewed in detail with the patient. Please refer to After Visit Summary for other counseling recommendations.   Return in about 2 weeks (around 01/27/2024).  Future Appointments  Date Time Provider Department Center  01/28/2024  8:35 AM Delores Nidia CROME, FNP [id number]  02/04/2024  8:15 AM Delores Nidia CROME, FNP [id number]  02/11/2024  8:55 AM Delores Nidia CROME, FNP [id number]  02/18/2024  8:15 AM Delores Nidia CROME, FNP [facility] [phone] [facility]    Ronal GORMAN Pinal, MD  "

## 2024-01-14 ENCOUNTER — Encounter (HOSPITAL_BASED_OUTPATIENT_CLINIC_OR_DEPARTMENT_OTHER): Admitting: Certified Nurse Midwife

## 2024-01-16 NOTE — L&D Delivery Note (Signed)
 OB/GYN Faculty Practice Delivery Note  Iriana Artley is a 39 y.o. G3P3003 s/p NVD at [redacted]w[redacted]d. She was admitted for SOL.   ROM: 0h 80m with pink tinged fluid GBS Status: Positive/-- (01/13 0908)  Maximum Maternal Temperature: 97.9  Labor Progress: Initial SVE: 4/60/-2. AROM and Pitocin . She then progressed to complete.   Delivery Date/Time: 02/07/2024 11:37 AM Delivery: Called to room and patient was complete and pushing. Head delivered left occiput anterior. nuchal cord present x1, loose. Shoulder and body delivered in usual fashion. Infant with spontaneous cry, placed on mother's abdomen, dried and stimulated. Cord clamped x 2 after 1-minute delay, and cut by father. Cord blood collected . Placenta delivered spontaneously with gentle cord traction. Fundus firm with massage and Pitocin . Labia, perineum, vagina, and cervix were inspected and found to be intact.  Placenta:  spontaneous Intact Placenta to L&D Complications:none Lacerations: none QBL: 316mL Analgesia: IV pain meds  Newborn Data:  Living status:Living Gender:Female Apgars:8 ,9  Weight:3230 g           Barabara Maier, DO [facility]

## 2024-01-21 ENCOUNTER — Encounter (HOSPITAL_BASED_OUTPATIENT_CLINIC_OR_DEPARTMENT_OTHER): Admitting: Certified Nurse Midwife

## 2024-01-28 ENCOUNTER — Other Ambulatory Visit (HOSPITAL_COMMUNITY)
Admission: RE | Admit: 2024-01-28 | Discharge: 2024-01-28 | Disposition: A | Discharge status: Home / Self Care | Source: Ambulatory Visit | Attending: Certified Nurse Midwife | Admitting: Certified Nurse Midwife

## 2024-01-28 ENCOUNTER — Ambulatory Visit (INDEPENDENT_AMBULATORY_CARE_PROVIDER_SITE_OTHER): Payer: Self-pay | Admitting: Obstetrics and Gynecology

## 2024-01-28 VITALS — BP 127/80 | HR 88 | Wt 185.8 lb

## 2024-01-28 DIAGNOSIS — Z3493 Encounter for supervision of normal pregnancy, unspecified, third trimester: Secondary | ICD-10-CM | POA: Insufficient documentation

## 2024-01-28 DIAGNOSIS — Z3A36 36 weeks gestation of pregnancy: Secondary | ICD-10-CM | POA: Insufficient documentation

## 2024-01-28 DIAGNOSIS — Z822 Family history of deafness and hearing loss: Secondary | ICD-10-CM

## 2024-01-28 DIAGNOSIS — O0993 Supervision of high risk pregnancy, unspecified, third trimester: Secondary | ICD-10-CM

## 2024-01-28 DIAGNOSIS — O09523 Supervision of elderly multigravida, third trimester: Secondary | ICD-10-CM

## 2024-01-28 DIAGNOSIS — Z1332 Encounter for screening for maternal depression: Secondary | ICD-10-CM

## 2024-01-28 DIAGNOSIS — Z3480 Encounter for supervision of other normal pregnancy, unspecified trimester: Secondary | ICD-10-CM

## 2024-01-28 DIAGNOSIS — O09529 Supervision of elderly multigravida, unspecified trimester: Secondary | ICD-10-CM

## 2024-01-28 NOTE — Progress Notes (Signed)
" ° °  PRENATAL VISIT NOTE  Subjective:  Jade Walters is a 39 y.o. G3P2002 at [redacted]w[redacted]d being seen today for ongoing prenatal care.  She is currently monitored for the following issues for this low-risk pregnancy and has Antepartum multigravida of advanced maternal age; Family history of congenital hearing loss; Supervision of other normal pregnancy, antepartum; and Second trimester bleeding on their problem list.  Patient reports no complaints.  Contractions: Not present. Vag. Bleeding: None.  Movement: Present. Denies leaking of fluid.   The following portions of the patient's history were reviewed and updated as appropriate: allergies, current medications, past family history, past medical history, past social history, past surgical history and problem list.   Objective:   Vitals:   01/28/24 0838  BP: 127/80  Pulse: 88  Weight: 185 lb 12.8 oz (84.3 kg)    Fetal Status:  Fetal Heart Rate (bpm): 134 Fundal Height: 35 cm Movement: Present Presentation: Vertex  General: Alert, oriented and cooperative. Patient is in no acute distress.  Skin: Skin is warm and dry. No rash noted.   Cardiovascular: Normal heart rate noted  Respiratory: Normal respiratory effort, no problems with respiration noted  Abdomen: Soft, gravid, appropriate for gestational age.  Pain/Pressure: Present (pressure)     Pelvic: Cervical exam performed in the presence of a chaperone Dilation: 1.5 Effacement (%): Thick Station: -3  Extremities: Normal range of motion.  Edema: None  Mental Status: Normal mood and affect. Normal behavior. Normal judgment and thought content.   Assessment and Plan:  Pregnancy: G3P2002 at [redacted]w[redacted]d 1. Supervision of high risk pregnancy in third trimester (Primary)   2. [redacted] weeks gestation of pregnancy Swabs today  Labor precautions discussed  - Culture, beta strep (group b only) - Cervicovaginal ancillary only( [place])  3. Antepartum multigravida of advanced maternal age 67/9  u/s EFW 64%, afi normal, normal anatomy , not further u/s   4. Family history of congenital hearing loss   Preterm labor symptoms and general obstetric precautions including but not limited to vaginal bleeding, contractions, leaking of fluid and fetal movement were reviewed in detail with the patient. Please refer to After Visit Summary for other counseling recommendations.   Return in one week for LOB   Future Appointments  Date Time Provider Department Center  02/04/2024  8:15 AM Delores Nidia CROME, FNP [id number]  02/11/2024  8:55 AM Delores Nidia CROME, FNP [id number]  02/18/2024  8:15 AM Delores Nidia CROME, FNP [facility] [phone] [facility]    Nidia Delores, FNP "

## 2024-01-29 LAB — CERVICOVAGINAL ANCILLARY ONLY
Chlamydia: NEGATIVE
Comment: NEGATIVE
Comment: NEGATIVE
Comment: NORMAL
Neisseria Gonorrhea: NEGATIVE
Trichomonas: NEGATIVE

## 2024-01-31 LAB — CULTURE, BETA STREP (GROUP B ONLY): Strep Gp B Culture: POSITIVE — AB

## 2024-02-01 ENCOUNTER — Ambulatory Visit: Payer: Self-pay | Admitting: Obstetrics and Gynecology

## 2024-02-01 DIAGNOSIS — O99820 Streptococcus B carrier state complicating pregnancy: Secondary | ICD-10-CM | POA: Insufficient documentation

## 2024-02-04 ENCOUNTER — Ambulatory Visit (INDEPENDENT_AMBULATORY_CARE_PROVIDER_SITE_OTHER): Admitting: Obstetrics and Gynecology

## 2024-02-04 VITALS — BP 128/65 | HR 91 | Wt 186.6 lb

## 2024-02-04 DIAGNOSIS — O0993 Supervision of high risk pregnancy, unspecified, third trimester: Secondary | ICD-10-CM

## 2024-02-04 DIAGNOSIS — Z822 Family history of deafness and hearing loss: Secondary | ICD-10-CM

## 2024-02-04 DIAGNOSIS — Z3A37 37 weeks gestation of pregnancy: Secondary | ICD-10-CM | POA: Diagnosis not present

## 2024-02-04 DIAGNOSIS — O09893 Supervision of other high risk pregnancies, third trimester: Secondary | ICD-10-CM

## 2024-02-04 DIAGNOSIS — O09529 Supervision of elderly multigravida, unspecified trimester: Secondary | ICD-10-CM

## 2024-02-04 DIAGNOSIS — O09523 Supervision of elderly multigravida, third trimester: Secondary | ICD-10-CM | POA: Diagnosis not present

## 2024-02-04 DIAGNOSIS — O99820 Streptococcus B carrier state complicating pregnancy: Secondary | ICD-10-CM

## 2024-02-04 NOTE — Progress Notes (Signed)
" ° °  PRENATAL VISIT NOTE  Subjective:  Jade Walters is a 39 y.o. G3P2002 at [redacted]w[redacted]d being seen today for ongoing prenatal care.  She is currently monitored for the following issues for this low-risk pregnancy and has Antepartum multigravida of advanced maternal age; Family history of congenital hearing loss; Supervision of other normal pregnancy, antepartum; Second trimester bleeding; and Group B Streptococcus carrier, +RV culture, currently pregnant on their problem list.  Patient reports occasional contractions.  Contractions: Irregular. Vag. Bleeding: None.  Movement: Present. Denies leaking of fluid.   The following portions of the patient's history were reviewed and updated as appropriate: allergies, current medications, past family history, past medical history, past social history, past surgical history and problem list.   Objective:   Vitals:   02/04/24 0824  BP: 128/65  Pulse: 91  Weight: 186 lb 9.6 oz (84.6 kg)    Fetal Status:  Fetal Heart Rate (bpm): 142 Fundal Height: 37 cm Movement: Present Presentation: Vertex  General: Alert, oriented and cooperative. Patient is in no acute distress.  Skin: Skin is warm and dry. No rash noted.   Cardiovascular: Normal heart rate noted  Respiratory: Normal respiratory effort, no problems with respiration noted  Abdomen: Soft, gravid, appropriate for gestational age.  Pain/Pressure: Present     Pelvic: Cervical exam deferred Dilation: 3 Effacement (%): 50 Station: -3  Extremities: Normal range of motion.  Edema: None  Mental Status: Normal mood and affect. Normal behavior. Normal judgment and thought content.   Assessment and Plan:  Pregnancy: G3P2002 at [redacted]w[redacted]d 1. Supervision of high risk pregnancy in third trimester (Primary) BP and FHR normal  Discussed labor precautions and labor readiness  2. [redacted] weeks gestation of pregnancy   3. Antepartum multigravida of advanced maternal age   12. Family history of congenital hearing  loss   5. Group B Streptococcus carrier, +RV culture, currently pregnant Tx in labor    Term labor symptoms and general obstetric precautions including but not limited to vaginal bleeding, contractions, leaking of fluid and fetal movement were reviewed in detail with the patient. Please refer to After Visit Summary for other counseling recommendations.   No follow-ups on file.  Future Appointments  Date Time Provider Department Center  02/11/2024  8:55 AM Delores Nidia CROME, FNP [id number]  02/18/2024  8:15 AM Delores Nidia CROME, FNP [facility] [phone]    Nidia Delores, FNP "

## 2024-02-06 ENCOUNTER — Encounter (HOSPITAL_COMMUNITY): Payer: Self-pay | Admitting: Obstetrics and Gynecology

## 2024-02-06 ENCOUNTER — Inpatient Hospital Stay (HOSPITAL_COMMUNITY)
Admission: AD | Admit: 2024-02-06 | Discharge: 2024-02-06 | Disposition: A | Discharge status: Home / Self Care | Source: Home / Self Care | Attending: Obstetrics and Gynecology | Admitting: Obstetrics and Gynecology

## 2024-02-06 DIAGNOSIS — Z3A37 37 weeks gestation of pregnancy: Secondary | ICD-10-CM | POA: Insufficient documentation

## 2024-02-06 DIAGNOSIS — Z3689 Encounter for other specified antenatal screening: Secondary | ICD-10-CM

## 2024-02-06 DIAGNOSIS — O471 False labor at or after 37 completed weeks of gestation: Secondary | ICD-10-CM | POA: Insufficient documentation

## 2024-02-06 DIAGNOSIS — O479 False labor, unspecified: Secondary | ICD-10-CM

## 2024-02-06 HISTORY — DX: Other specified health status: Z78.9

## 2024-02-06 LAB — RUPTURE OF MEMBRANE (ROM)PLUS: Rom Plus: NEGATIVE

## 2024-02-06 LAB — POCT FERN TEST: POCT Fern Test: NEGATIVE

## 2024-02-06 NOTE — MAU Note (Addendum)
 Jade Walters is a 40 y.o. at [redacted]w[redacted]d here in MAU reporting:   Pt states she had intercourse around 1030pm last night, ctxs have been every since. +FM. Pt states she keeps going to the bathroom to urinate. Pt reports mucous discharge and her mucous plug keeps coming out. Unsure if her water broke. She wanted to come in and get checked out to make sure everything was okay with baby.   Pt states she feels lower back right sided pain 6/10 with ctxs.  Gbs pos. [place], Last CE 3cm.    Vitals:   02/06/24 0343  BP: 127/80  Pulse: 96  Resp: 17  Temp: 98.1 F (36.7 C)     FHT: 129 Lab orders placed from triage: [place] slide/labor eval

## 2024-02-06 NOTE — Discharge Instructions (Signed)
 Early Labor Tips   Rest when able, especially if your labor starts at night. Side lying positions can feel better than lying on your back  Move around if you aren't able to rest or if your labor starts at night.  Gentle movement, upright or forward-leaning positions can help your baby to be in a good position and put more pressure on your cervix so it can open. Hip circles standing or on a birth ball can help.  Drink water and eat easily digestible snacks. You may also consider an electrolyte drink (like Gatorade) if you are nauseated or do not have an appetite. Distract yourself with a movie, podcasts, walking outside, music, etc.   The [place] is a series of exercises you can do in early labor to encourage your baby into a better position to make progress in labor.  It is available with pictures at themilescircuit.com  Pain management at home  Try any relaxation and breathing techniques you have learned in antenatal classes. Have a massage. Your birth partner could help by rubbing your back. Take acetaminophen  (Tylenol ) according to the instructions on the packet - it's safe to take in labour. Have a warm bath or shower. 5.  You may use a TENS unit if you have access to one.

## 2024-02-06 NOTE — MAU Provider Note (Signed)
 S: Ms. Dimple Bastyr is a 39 y.o. G3P2002 at [redacted]w[redacted]d  who presents to MAU today for labor evaluation.   She was evaluated, by the nurse, and has made little to no cervical change since arrival.  The nurse requests provider review strip and discharge as appropriate.   Cervical exam by RN:  Dilation: 4 Effacement (%): 60 Cervical Position: Middle Station: -2 Presentation: Vertex Exam by:: Oleh Loges RN Patient unchanged after >1 hour.   Fetal Monitoring: Baseline: 125 Variability: moderate Accelerations: 15x15 Decelerations: absent Contractions: irregular  MDM Patient status discussed with RN.  EFM Reviewed  A: 39 y.o. G3P2 [redacted]w[redacted]d  Uterine contractions  False labor - Plan: Discharge patient  NST (non-stress test) reactive  Uterine contractions  [redacted] weeks gestation of pregnancy  P: Discharge orders placed. Nurse instructed to: -Educate patient on labor precautions and fetal movement. -Include in AVS -Inform patient to follow-up with [facility] as scheduled. -Encourage patient to return, to MAU, as needed or with worsening or onset of new symptoms.   Camie DELENA Rote MSN, CNM  02/06/2024 5:21 AM

## 2024-02-07 ENCOUNTER — Other Ambulatory Visit: Payer: Self-pay

## 2024-02-07 ENCOUNTER — Inpatient Hospital Stay (HOSPITAL_COMMUNITY)
Admission: AD | Admit: 2024-02-07 | Discharge: 2024-02-08 | Disposition: A | Payer: Self-pay | Attending: Obstetrics and Gynecology | Admitting: Obstetrics and Gynecology

## 2024-02-07 ENCOUNTER — Encounter (HOSPITAL_COMMUNITY): Payer: Self-pay | Admitting: Obstetrics and Gynecology

## 2024-02-07 ENCOUNTER — Other Ambulatory Visit (HOSPITAL_COMMUNITY): Payer: Self-pay

## 2024-02-07 DIAGNOSIS — O09529 Supervision of elderly multigravida, unspecified trimester: Secondary | ICD-10-CM

## 2024-02-07 DIAGNOSIS — O99820 Streptococcus B carrier state complicating pregnancy: Secondary | ICD-10-CM

## 2024-02-07 LAB — CBC
HCT: 36.7 % (ref 36.0–46.0)
Hemoglobin: 13 g/dL (ref 12.0–15.0)
MCH: 32.7 pg (ref 26.0–34.0)
MCHC: 35.4 g/dL (ref 30.0–36.0)
MCV: 92.2 fL (ref 80.0–100.0)
Platelets: 309 K/uL (ref 150–400)
RBC: 3.98 MIL/uL (ref 3.87–5.11)
RDW: 12.9 % (ref 11.5–15.5)
WBC: 10.9 K/uL — ABNORMAL HIGH (ref 4.0–10.5)
nRBC: 0 % (ref 0.0–0.2)

## 2024-02-07 LAB — TYPE AND SCREEN
ABO/RH(D): A POS
Antibody Screen: NEGATIVE

## 2024-02-07 LAB — SYPHILIS: RPR W/REFLEX TO RPR TITER AND TREPONEMAL ANTIBODIES, TRADITIONAL SCREENING AND DIAGNOSIS ALGORITHM: RPR Ser Ql: NONREACTIVE

## 2024-02-07 LAB — ABO/RH: ABO/RH(D): A POS

## 2024-02-07 MED ORDER — ACETAMINOPHEN 500 MG PO TABS
1000.0000 mg | ORAL_TABLET | Freq: Four times a day (QID) | ORAL | Status: DC
  Administered 2024-02-07 – 2024-02-08 (×3): 1000 mg via ORAL
  Filled 2024-02-07 (×4): qty 2

## 2024-02-07 MED ORDER — OXYCODONE-ACETAMINOPHEN 5-325 MG PO TABS: 2.0000 | ORAL_TABLET | ORAL | Status: DC | PRN

## 2024-02-07 MED ORDER — PHENYLEPHRINE 80 MCG/ML (10ML) SYRINGE FOR IV PUSH (FOR BLOOD PRESSURE SUPPORT): 80.0000 ug | PREFILLED_SYRINGE | INTRAVENOUS | Status: DC | PRN

## 2024-02-07 MED ORDER — IBUPROFEN 600 MG PO TABS
600.0000 mg | ORAL_TABLET | Freq: Four times a day (QID) | ORAL | 0 refills | Status: AC | PRN
  Filled 2024-02-07: qty 30, 8d supply, fill #0

## 2024-02-07 MED ORDER — SENNOSIDES-DOCUSATE SODIUM 8.6-50 MG PO TABS
2.0000 | ORAL_TABLET | Freq: Every evening | ORAL | 0 refills | Status: AC | PRN
  Filled 2024-02-07: qty 30, 15d supply, fill #0

## 2024-02-07 MED ORDER — OXYCODONE-ACETAMINOPHEN 5-325 MG PO TABS: 1.0000 | ORAL_TABLET | ORAL | Status: DC | PRN

## 2024-02-07 MED ORDER — ACETAMINOPHEN 500 MG PO TABS
1000.0000 mg | ORAL_TABLET | Freq: Four times a day (QID) | ORAL | 0 refills | Status: AC | PRN
  Filled 2024-02-07: qty 30, 4d supply, fill #0

## 2024-02-07 MED ORDER — LIDOCAINE HCL (PF) 1 % IJ SOLN: 30.0000 mL | INTRAMUSCULAR | Status: DC | PRN

## 2024-02-07 MED ORDER — OXYTOCIN-SODIUM CHLORIDE 30-0.9 UT/500ML-% IV SOLN: 2.5000 [IU]/h | INTRAVENOUS | Status: DC

## 2024-02-07 MED ORDER — ONDANSETRON HCL 4 MG/2ML IJ SOLN: 4.0000 mg | Freq: Four times a day (QID) | INTRAMUSCULAR | Status: DC | PRN

## 2024-02-07 MED ORDER — ONDANSETRON HCL 4 MG/2ML IJ SOLN: 4.0000 mg | INTRAMUSCULAR | Status: DC | PRN

## 2024-02-07 MED ORDER — ONDANSETRON HCL 4 MG PO TABS: 4.0000 mg | ORAL_TABLET | ORAL | Status: DC | PRN

## 2024-02-07 MED ORDER — EPHEDRINE 5 MG/ML INJ: 10.0000 mg | INTRAVENOUS | Status: DC | PRN

## 2024-02-07 MED ORDER — OXYTOCIN-SODIUM CHLORIDE 30-0.9 UT/500ML-% IV SOLN
1.0000 m[IU]/min | INTRAVENOUS | Status: DC
  Administered 2024-02-07: 2 m[IU]/min via INTRAVENOUS
  Filled 2024-02-07: qty 500

## 2024-02-07 MED ORDER — WITCH HAZEL-GLYCERIN EX PADS: 1.0000 | MEDICATED_PAD | CUTANEOUS | Status: DC | PRN

## 2024-02-07 MED ORDER — LACTATED RINGERS IV SOLN: 500.0000 mL | Freq: Once | INTRAVENOUS | Status: DC

## 2024-02-07 MED ORDER — INFLUENZA VIRUS VACC SPLIT PF (FLUZONE) 0.5 ML IM SUSY: 0.5000 mL | PREFILLED_SYRINGE | INTRAMUSCULAR | Status: DC

## 2024-02-07 MED ORDER — MAGNESIUM HYDROXIDE 400 MG/5ML PO SUSP: 30.0000 mL | Freq: Every day | ORAL | Status: DC | PRN

## 2024-02-07 MED ORDER — SIMETHICONE 80 MG PO CHEW: 80.0000 mg | CHEWABLE_TABLET | ORAL | Status: DC | PRN

## 2024-02-07 MED ORDER — ACETAMINOPHEN 325 MG PO TABS: 650.0000 mg | ORAL_TABLET | ORAL | Status: DC | PRN

## 2024-02-07 MED ORDER — FENTANYL CITRATE (PF) 100 MCG/2ML IJ SOLN
50.0000 ug | INTRAMUSCULAR | Status: DC | PRN
  Administered 2024-02-07: 100 ug via INTRAVENOUS
  Administered 2024-02-07: 50 ug via INTRAVENOUS
  Filled 2024-02-07 (×2): qty 2

## 2024-02-07 MED ORDER — LACTATED RINGERS IV SOLN: INTRAVENOUS | Status: DC

## 2024-02-07 MED ORDER — SENNOSIDES-DOCUSATE SODIUM 8.6-50 MG PO TABS
2.0000 | ORAL_TABLET | Freq: Every evening | ORAL | Status: DC | PRN
  Administered 2024-02-07: 2 via ORAL
  Filled 2024-02-07: qty 2

## 2024-02-07 MED ORDER — TETANUS-DIPHTH-ACELL PERTUSSIS 5-2-15.5 LF-MCG/0.5 IM SUSP: 0.5000 mL | Freq: Once | INTRAMUSCULAR | Status: DC

## 2024-02-07 MED ORDER — TERBUTALINE SULFATE 1 MG/ML IJ SOLN: 0.2500 mg | Freq: Once | INTRAMUSCULAR | Status: DC | PRN

## 2024-02-07 MED ORDER — PENICILLIN G POT IN DEXTROSE 60000 UNIT/ML IV SOLN
3.0000 10*6.[IU] | INTRAVENOUS | Status: DC
  Administered 2024-02-07: 3 10*6.[IU] via INTRAVENOUS
  Filled 2024-02-07: qty 50

## 2024-02-07 MED ORDER — MEASLES, MUMPS & RUBELLA VAC ~~LOC~~ SUSR: 0.5000 mL | Freq: Once | SUBCUTANEOUS | Status: DC

## 2024-02-07 MED ORDER — DIPHENHYDRAMINE HCL 50 MG/ML IJ SOLN: 12.5000 mg | INTRAMUSCULAR | Status: DC | PRN

## 2024-02-07 MED ORDER — SOD CITRATE-CITRIC ACID 500-334 MG/5ML PO SOLN: 30.0000 mL | ORAL | Status: DC | PRN

## 2024-02-07 MED ORDER — COCONUT OIL OIL: 1.0000 | TOPICAL_OIL | Status: DC | PRN

## 2024-02-07 MED ORDER — DIBUCAINE (PERIANAL) 1 % EX OINT: 1.0000 | TOPICAL_OINTMENT | CUTANEOUS | Status: DC | PRN

## 2024-02-07 MED ORDER — MEDROXYPROGESTERONE ACETATE 150 MG/ML IM SUSP: 150.0000 mg | INTRAMUSCULAR | Status: DC | PRN

## 2024-02-07 MED ORDER — IBUPROFEN 600 MG PO TABS
600.0000 mg | ORAL_TABLET | Freq: Four times a day (QID) | ORAL | Status: DC
  Administered 2024-02-07 – 2024-02-08 (×4): 600 mg via ORAL
  Filled 2024-02-07 (×4): qty 1

## 2024-02-07 MED ORDER — FENTANYL-BUPIVACAINE-NACL 0.5-0.125-0.9 MG/250ML-% EP SOLN: 12.0000 mL/h | EPIDURAL | Status: DC | PRN

## 2024-02-07 MED ORDER — PRENATAL MULTIVITAMIN CH
1.0000 | ORAL_TABLET | Freq: Every day | ORAL | Status: DC
  Administered 2024-02-07 – 2024-02-08 (×2): 1 via ORAL
  Filled 2024-02-07 (×2): qty 1

## 2024-02-07 MED ORDER — LACTATED RINGERS IV SOLN: 500.0000 mL | INTRAVENOUS | Status: DC | PRN

## 2024-02-07 MED ORDER — OXYTOCIN BOLUS FROM INFUSION
333.0000 mL | Freq: Once | INTRAVENOUS | Status: DC
  Administered 2024-02-07: 333 mL via INTRAVENOUS

## 2024-02-07 MED ORDER — DIPHENHYDRAMINE HCL 25 MG PO CAPS: 25.0000 mg | ORAL_CAPSULE | Freq: Four times a day (QID) | ORAL | Status: DC | PRN

## 2024-02-07 MED ORDER — BENZOCAINE-MENTHOL 20-0.5 % EX AERO: 1.0000 | INHALATION_SPRAY | CUTANEOUS | Status: DC | PRN

## 2024-02-07 MED ORDER — SODIUM CHLORIDE 0.9 % IV SOLN
5.0000 10*6.[IU] | Freq: Once | INTRAVENOUS | Status: AC
  Administered 2024-02-07: 5 10*6.[IU] via INTRAVENOUS
  Filled 2024-02-07: qty 5

## 2024-02-07 NOTE — Lactation Note (Addendum)
 This note was copied from a baby's chart. Lactation Consultation Note  Patient Name: Jade Walters Date: 02/07/2024 Age:37 hours Reason for consult: Initial assessment;Early term 37-38.6wks,  P3, Per MOB, infant is latching well no concerns, MOB latched infant on her left breast using the cross cradle hold, infant breastfeed for 12 minutes with depth, sustained her latch. MOB will continue to breastfeed infant by cues, on demand, 8-12 times within 24 hours, skin to skin. MOB knows to call for latch assistance if needed. MOB will continue to breastfeed infant by cues, on demand, 8-12 times within 24 hours, skin to skin. MOB was made aware of O/P services, breastfeeding support groups, community resources, and our phone # for post-discharge questions.    MOB has DEBP at home.  Maternal Data Has patient been taught Hand Expression?: Yes Does the patient have breastfeeding experience prior to this delivery?: Yes Per MOB, she breastfeed her first child for 18 months and her 2nd child for 60 months of age.   Feeding Mother's Current Feeding Choice: Breast Milk  LATCH Score Latch: Grasps breast easily, tongue down, lips flanged, rhythmical sucking.  Audible Swallowing: A few with stimulation  Type of Nipple: Everted at rest and after stimulation  Comfort (Breast/Nipple): Soft / non-tender  Hold (Positioning): Assistance needed to correctly position infant at breast and maintain latch.  LATCH Score: 8   Lactation Tools Discussed/Used    Interventions Interventions: Breast feeding basics reviewed;Assisted with latch;Skin to skin;Breast compression;Adjust position;Support pillows;Position options;Expressed milk;DEBP;Pace feeding;Education;LC Services brochure;Guidelines for Milk Supply and Pumping Schedule Handout;CDC milk storage guidelines;CDC Guidelines for Breast Pump Cleaning  Discharge Pump: DEBP;Personal  Consult Status Consult Status: Follow-up Date:  02/08/24 Follow-up type: In-patient    Grayce LULLA Batter 02/07/2024, 10:38 PM

## 2024-02-07 NOTE — MAU Note (Addendum)
 Jade Walters is a 39 y.o. at [redacted]w[redacted]d here in MAU reporting pain in R lower back that sometimes goes around to the front of her lower abd. Back pain is constant and not like ctxs that go away.  Pain present since early Thurs morning. Denies VB or LOF. States she has not felt the baby move as much all day Thurs. Having occ ctxs   LMP: na Onset of complaint: Thurs am Pain score: 7 Vitals:   02/07/24 0208 02/07/24 0212  BP:  112/81  Pulse: 99   Resp: 17   Temp: (!) 97.5 F (36.4 C)   SpO2: 99%      FHT: 134  Lab orders placed from triage: labor eval

## 2024-02-07 NOTE — MAU Provider Note (Signed)
"   None     S [name] is a 39 y.o. G22P2002 pregnant female at [redacted]w[redacted]d who presents to MAU today with complaint of back pain and DFM.   Receives care at [facility]. Prenatal records reviewed.  Pertinent items noted in HPI and remainder of comprehensive ROS otherwise negative.   O BP 112/81   Pulse 99   Temp (!) 97.5 F (36.4 C)   Resp 17   Ht 5' 5 (1.651 m)   Wt 86 kg   LMP 05/19/2023   SpO2 99%   BMI 31.55 kg/m  Physical Exam Vitals reviewed.  Constitutional:      General: She is not in acute distress.    Appearance: Normal appearance. She is not ill-appearing, toxic-appearing or diaphoretic.  HENT:     Head: Normocephalic.  Cardiovascular:     Rate and Rhythm: Normal rate and regular rhythm.     Pulses: Normal pulses.     Heart sounds: Normal heart sounds.  Pulmonary:     Effort: Pulmonary effort is normal.     Breath sounds: Normal breath sounds.  Abdominal:     Comments: Gravid  Skin:    General: Skin is warm and dry.     Capillary Refill: Capillary refill takes less than 2 seconds.  Neurological:     General: No focal deficit present.     Mental Status: She is alert and oriented to person, place, and time.  Psychiatric:        Mood and Affect: Mood normal.        Behavior: Behavior normal.        Thought Content: Thought content normal.        Judgment: Judgment normal.      MDM: Low Evaluate for back pain, likely related to labor. SCE changed.   MAU Course:  A Normal labor  Medical screening exam complete  P Admit to labor  Camie Rote, MSN, CNM 02/07/2024 2:51 AM  Certified Nurse Midwife, [facility]  "

## 2024-02-07 NOTE — Discharge Summary (Cosign Needed)
 "    Postpartum Discharge Summary     Patient Name: Jade Walters DOB: 08/03/1985 MRN: [id number]  Date of admission: 02/07/2024 Delivery date:02/07/2024 Delivering provider: DANNY GERALDS Date of discharge: 02/08/2024  Admitting diagnosis: Normal labor [O80, Z37.9] Intrauterine pregnancy: [redacted]w[redacted]d     Secondary diagnosis:  Principal Problem:   Normal labor Active Problems:   AMA (advanced maternal age) multigravida 35+   Group B Streptococcus carrier, +RV culture, currently pregnant  Additional problems:     Discharge diagnosis: Term Pregnancy Delivered                                              Post partum procedures:none Augmentation: AROM and Pitocin  Complications: None  Hospital course: Onset of Labor With Vaginal Delivery      39 y.o. yo G3P3003 at [redacted]w[redacted]d was admitted in Latent Labor on 02/07/2024. Labor course was complicated by none  Membrane Rupture Time/Date: 10:45 AM,02/07/2024  Delivery Method:Vaginal, Spontaneous Operative Delivery:N/A Episiotomy: None Lacerations:  None Patient had a postpartum course complicated by none.  She is ambulating, tolerating a regular diet, passing flatus, and urinating well. Patient is discharged home in stable condition on 02/08/24.  Newborn Data: Birth date:02/07/2024 Birth time:11:37 AM Gender:Female Living status:Living Apgars:8 ,9  Weight:3230 g  Magnesium  Sulfate received: No BMZ received: No Rhophylac:N/A MMR:N/A T-DaP:declined Flu: declined RSV Vaccine received: No Transfusion:No  Immunizations received: Immunization History  Administered Date(s) Administered   Influenza,inj,Quad PF,6+ Mos 12/22/2019    Physical exam  Vitals:   02/07/24 1816 02/07/24 2139 02/08/24 0149 02/08/24 0504  BP: 113/74 101/63 107/75 112/60  Pulse: (!) 109 86 75 92  Resp: 16 19 18 18   Temp: 98.6 F (37 C) 98.2 F (36.8 C) 97.6 F (36.4 C) 98.3 F (36.8 C)  TempSrc: Oral Oral Oral Oral  SpO2: 98% 98% 98% 100%  Weight:       Height:       General: alert and cooperative Lochia: appropriate Uterine Fundus: firm Incision: N/A DVT Evaluation: No significant calf/ankle edema. Labs: Lab Results  Component Value Date   WBC 10.9 (H) 02/07/2024   HGB 13.0 02/07/2024   HCT 36.7 02/07/2024   MCV 92.2 02/07/2024   PLT 309 02/07/2024      Latest Ref Rng & Units 07/26/2016    7:32 PM  CMP  Glucose 65 - 99 mg/dL 891   BUN 6 - 20 mg/dL 18   Creatinine 9.55 - 1.00 mg/dL 9.29   Sodium 864 - 854 mmol/L 141   Potassium 3.5 - 5.1 mmol/L 3.5   Chloride 101 - 111 mmol/L 101    Edinburgh Score:    02/08/2024    1:50 AM  Edinburgh Postnatal Depression Scale Screening Tool  I have been able to laugh and see the funny side of things. 0   Edinburgh Postnatal Depression Scale Total: 2   After visit meds:  Allergies as of 02/08/2024   No Known Allergies      Medication List     TAKE these medications    Acetaminophen  Extra Strength 500 MG Tabs Take 2 tablets (1,000 mg total) by mouth every 6 (six) hours as needed for moderate pain (pain score 4-6).   ibuprofen  600 MG tablet Commonly known as: ADVIL  Take 1 tablet (600 mg total) by mouth every 6 (six) hours as needed for moderate pain (pain score  4-6).   prenatal multivitamin Tabs tablet Take 1 tablet by mouth daily at 12 noon.   Stool Softener/Laxative 50-8.6 MG tablet Generic drug: senna-docusate Take 2 tablets by mouth at bedtime as needed for mild constipation or moderate constipation.         Discharge home in stable condition Infant Feeding: Breast Infant Disposition:home with mother Discharge instruction: per After Visit Summary and Postpartum booklet. Activity: Advance as tolerated. Pelvic rest for 6 weeks.  Diet: routine diet Future Appointments: Future Appointments  Date Time Provider Department Center  02/11/2024  8:55 AM Delores Nidia CROME, FNP [facility] [facility]  02/18/2024  8:15 AM Delores Nidia CROME, FNP [facility] [facility]   03/19/2024  2:55 PM Delores Nidia CROME, FNP [facility] [phone] [facility]   Follow up Visit: Message sent 1/23  Please schedule this patient for a In person postpartum visit in 6 weeks with the following provider: Any provider. Additional Postpartum F/U:none  Low risk pregnancy complicated by: none Delivery mode:  Vaginal, Spontaneous  Anticipated Birth Control:  NFP/withdrawal   02/08/2024 Barabara Maier, DO    "

## 2024-02-07 NOTE — H&P (Signed)
 OBSTETRIC ADMISSION HISTORY AND PHYSICAL  Jade Walters is a 39 y.o. female G3P2002 with IUP at [redacted]w[redacted]d by LMP presenting for back pain. She reports +FMs, No LOF, no VB, no blurry vision, headaches or peripheral edema, and RUQ pain.  She plans on breast feeding. She requests nothing for birth control. She received her prenatal care at [facility]   Dating: By LMP --->  Estimated Date of Delivery: 02/23/24  Sono:    @[redacted]w[redacted]d , CWD, normal anatomy, cephalic presentation, posterior placenta, 1890g, 64% EFW   Prenatal History/Complications:  NURSING  PROVIDER  Office Location [facility] Dating by LMP c/w U/S at 8 wks  [facility] Model Traditional Anatomy U/S 10/01/2023  Initiated care at  [facility]                 Language  English               LAB RESULTS   Support Person Spouse [employer] NIPS: Declined AFP: Declines      NT/IT (FT only)        Carrier Screen Horizon: Declined  Rhogam  A/Positive/-- (07/29 1143) A1C/GTT Early HgbA1C: not obtained Third trimester 2 hr GTT:  Glucose, Fasting 80  Glucose, 1 hour 164  Glucose, 2 hour 92    Flu Vaccine Declined 10/07/2023      TDaP Vaccine Declined 11/19/2023 Blood Type A/Positive/-- (07/29 1143)  RSV Vaccine Declined 11/19/2023 Antibody Negative (07/29 1143)  COVID Vaccine Declined 11/19/2023 Rubella 1.79 (07/29 1143)  Feeding Plan breast RPR Non Reactive (07/29 1143)  Contraception Desires none HBsAg Negative (07/29 1143)  Circumcision N/A HIV Non Reactive (07/29 1143)  Pediatrician  [facility] HCVAb  Neg  Prenatal Classes Info given      BTL Consent   Pap Neg with neg HR HPV 09/09/2023  BTL Pre-payment   GC/CT Initial:  Neg/Neg 36wks:    VBAC Consent   GBS For PCN allergy, check sensitivities   BRx Optimized? [ ]  yes   [ ]  no      DME Rx [ ]  BP cuff [ ]  Weight Scale Waterbirth  [ ]  Class [ ]  Consent [ ]  CNM visit  PHQ9 & GAD7 [x]  new OB [x]  28 weeks  [x]  36 weeks Induction  [ ]  Orders Entered [ ] Foley Y/N     Past Medical  History: Past Medical History:  Diagnosis Date   Allergy    Seasonal   Medical history non-contributory     Past Surgical History: History reviewed. No pertinent surgical history.  Obstetrical History: OB History     Gravida  3   Para  2   Term  2   Preterm  0   AB  0   Living  2      SAB  0   IAB  0   Ectopic  0   Multiple  0   Live Births  2           Social History Social History   Socioeconomic History   Marital status: Married    Spouse name: Anton   Number of children: 2   Years of education: Not on file   Highest education level: Not on file  Occupational History   Not on file  Tobacco Use   Smoking status: Never   Smokeless tobacco: Never  Vaping Use   Vaping status: Never Used  Substance and Sexual Activity   Alcohol use: No   Drug use: Never   Sexual activity:  Yes    Birth control/protection: None  Other Topics Concern   Not on file  Social History Narrative   Not on file   Social Drivers of Health   Tobacco Use: Low Risk (02/07/2024)   Patient History    Smoking Tobacco Use: Never    Smokeless Tobacco Use: Never    Passive Exposure: Not on file  Financial Resource Strain: Low Risk (08/14/2023)   Overall Financial Resource Strain (CARDIA)    Difficulty of Paying Living Expenses: Not hard at all  Food Insecurity: No Food Insecurity (01/11/2024)   Epic    Worried About Radiation Protection Practitioner of Food in the Last Year: Never true    Ran Out of Food in the Last Year: Never true  Transportation Needs: No Transportation Needs (01/11/2024)   Epic    Lack of Transportation (Medical): No    Lack of Transportation (Non-Medical): No  Physical Activity: Insufficiently Active (08/14/2023)   Exercise Vital Sign    Days of Exercise per Week: 3 days    Minutes of Exercise per Session: 30 min  Stress: No Stress Concern Present (08/14/2023)   [employer] of Occupational Health - Occupational Stress Questionnaire    Feeling of Stress: Only a  little  Social Connections: Socially Integrated (08/14/2023)   Social Connection and Isolation Panel    Frequency of Communication with Friends and Family: More than three times a week    Frequency of Social Gatherings with Friends and Family: More than three times a week    Attends Religious Services: More than 4 times per year    Active Member of Clubs or Organizations: Yes    Attends Banker Meetings: More than 4 times per year    Marital Status: Married  Depression (PHQ2-9): Low Risk (01/28/2024)   Depression (PHQ2-9)    PHQ-2 Score: 2  Alcohol Screen: Low Risk (08/14/2023)   Alcohol Screen    Last Alcohol Screening Score (AUDIT): 0  Housing: Unknown (01/11/2024)   Epic    Unable to Pay for Housing in the Last Year: No    Number of Times Moved in the Last Year: Not on file    Homeless in the Last Year: No  Utilities: Not At Risk (01/11/2024)   Epic    Threatened with loss of utilities: No  Health Literacy: Adequate Health Literacy (08/14/2023)   B1300 Health Literacy    Frequency of need for help with medical instructions: Never    Family History: Family History  Problem Relation Age of Onset   Hypertension Father    Hyperlipidemia Father    Hearing loss Son    Diabetes Maternal Grandmother    Cancer Maternal Grandfather    Asthma Neg Hx     Allergies: Allergies[1]  Medications Prior to Admission  Medication Sig Dispense Refill Last Dose/Taking   Prenatal Vit-Fe Fumarate-FA (PRENATAL MULTIVITAMIN) TABS tablet Take 1 tablet by mouth daily at 12 noon.   02/06/2024     Review of Systems   All systems reviewed and negative except as stated in HPI  Blood pressure 112/81, pulse 99, temperature (!) 97.5 F (36.4 C), resp. rate 17, height 5' 5 (1.651 m), weight 86 kg, last menstrual period 05/19/2023, SpO2 99%. General appearance: alert, cooperative, appears stated age, and no distress Lungs: clear to auscultation bilaterally Heart: regular rate and  rhythm Abdomen: soft, non-tender; bowel sounds normal Extremities: Homans sign is negative, no sign of DVT Presentation: cephalic Fetal monitoringBaseline: 120 bpm, Variability: Good {> 6  bpm), and Accelerations: Reactive Uterine activityFrequency: Irregular pattern, q10-70min Dilation: 5.5 Effacement (%): 80 Exam by:: Camie Rote, CNM   Prenatal labs: ABO, Rh: A/Positive/-- (07/29 1143) Antibody: Negative (07/29 1143) Rubella: 1.79 (07/29 1143) RPR: Non Reactive (11/04 0846)  HBsAg: Negative (07/29 1143)  HIV: Non Reactive (11/04 0846)  GBS: Positive/-- (01/13 0908)    Lab Results  Component Value Date   GBS Positive (A) 01/28/2024   GTT normal Genetic screening  declined Anatomy US  Normal. No makers of aneuploidies or fetal structural defects are seen. Fetal  biometry is consistent with her previously-established dates. Amniotic fluid is normal and good fetal activity is seen.  Immunization History  Administered Date(s) Administered   Influenza,inj,Quad PF,6+ Mos 12/22/2019    Prenatal Transfer Tool  Maternal Diabetes: No Genetic Screening: Declined Maternal Ultrasounds/Referrals: Normal Fetal Ultrasounds or other Referrals:  None Maternal Substance Abuse:  No Significant Maternal Medications:  None Significant Maternal Lab Results: Group B Strep positive Number of Prenatal Visits:greater than 3 verified prenatal visits Maternal Vaccinations: declined Other Comments:  None   Results for orders placed or performed during the hospital encounter of 02/06/24 (from the past 24 hours)  Fern Test   Collection Time: 02/06/24  3:50 AM  Result Value Ref Range   POCT Fern Test Negative = intact amniotic membranes   Rupture of Membrane (ROM) Plus   Collection Time: 02/06/24  4:09 AM  Result Value Ref Range   Rom Plus NEGATIVE     Patient Active Problem List   Diagnosis Date Noted   Group B Streptococcus carrier, +RV culture, currently pregnant 02/01/2024    Second trimester bleeding 09/12/2023   Antepartum multigravida of advanced maternal age 59/29/2025   Family history of congenital hearing loss 08/13/2023   Supervision of other normal pregnancy, antepartum 08/13/2023    Assessment/Plan:  Jade Walters is a 39 y.o. G3P2002 at [redacted]w[redacted]d here for back pain  #Labor: Expectant management #Pain: Per patient preference #FWB: Category I #GBS status:  Positive - PCN #Feeding: Breastmilk  #Reproductive Life planning: None #Circ:  not applicable  # Family history of congenital hearing loss   Garen SHAUNNA Puffer, MD  02/07/2024, 2:50 AM  Attestation of Supervision of Resident: Evaluation and management procedures were performed by the learners: Family Medicine Resident under my supervision. I was immediately available for direct supervision, assistance and direction throughout this encounter.  I also confirm that I have verified the information documented in the residents note, and that I have also personally reperformed the pertinent components of the physical exam and all of the medical decision making activities.  I have also made any necessary editorial changes.  Charlie DELENA Courts, MD [facility]OB Fellow Center for [facility]       [1] No Known Allergies

## 2024-02-07 NOTE — Progress Notes (Addendum)
 Labor Progress Note Jade Walters is a 39 y.o. G3P2002 at [redacted]w[redacted]d presented for SOL S:   O:  BP 123/71   Pulse 87   Temp 97.9 F (36.6 C) (Axillary)   Resp 16   Ht 5' 5 (1.651 m)   Wt 86 kg   LMP 05/19/2023   SpO2 99%   BMI 31.55 kg/m  EFM: baseline 130/moderate variability/reactive, no decels  CVE: Dilation: 6 Effacement (%): 90 Cervical Position: Posterior, Middle Station: -2 Presentation: Vertex Exam by:: Dr. Danny AROM, pink tinged fluid   A&P: 39 y.o. [id number] [redacted]w[redacted]d in spontaneous labor #Labor: Progressing well. S/p AROM. Pitocin  at 2.   #Pain: per patient request, s/p fentanyl  at 0841 and 1012 #FWB: Cat 1, contractions q2 minutes #GBS positive, s/p 2 doses of penicillin, continue Pen G 3MU q4h  Jade Scriver, DO 10:48 AM  Attestation of Supervision of Resident: Evaluation and management procedures were performed by the learners: Family Medicine Resident under my supervision. I was immediately available for direct supervision, assistance and direction throughout this encounter.  I also confirm that I have verified the information documented in the residents note, and that I have also personally reperformed the pertinent components of the physical exam and all of the medical decision making activities.  I have also made any necessary editorial changes.  Jade Danny, DO [facility]-OB Fellow Center for [employer]

## 2024-02-08 ENCOUNTER — Other Ambulatory Visit (HOSPITAL_COMMUNITY): Payer: Self-pay

## 2024-02-08 NOTE — Plan of Care (Signed)
  Problem: Education: Goal: Knowledge of condition will improve Outcome: Completed/Met   Problem: Education: Goal: Knowledge of condition will improve Outcome: Completed/Met

## 2024-02-08 NOTE — Lactation Note (Signed)
 This note was copied from a baby's chart. Lactation Consultation Note  Patient Name: Jade Walters Unijb'd Date: 02/08/2024 Age:37 hours, P3 , it's been 6 years  Reason for consult: Follow-up assessment;Early term 37-38.6wks;Infant weight loss Per mom breast feeding is going well and milk feels like its coming in.  LC reviewed breast feeding D/C teaching and the [facility] resources.  LC stressed the importance of engorgement prevention and tx.  Mom aware of the [facility] resources.   Maternal Data Has patient been taught Hand Expression?: Yes Does the patient have breastfeeding experience prior to this delivery?: Yes  Feeding Mother's Current Feeding Choice: Breast Milk  LATCH Score - 8   Lactation Tools Discussed/Used Tools: Pump;Flanges Flange Size: 18;21 Breast pump type: Manual Pump Education: Milk Storage;Setup, frequency, and cleaning  Interventions  Education   Discharge Discharge Education: Engorgement and breast care;Warning signs for feeding baby;Outpatient recommendation (if needed) Pump: DEBP;Manual;Personal  Consult Status Consult Status: Complete Date: 02/08/24    [name] 02/08/2024, 1:35 PM

## 2024-02-11 ENCOUNTER — Encounter (HOSPITAL_BASED_OUTPATIENT_CLINIC_OR_DEPARTMENT_OTHER): Admitting: Obstetrics and Gynecology

## 2024-02-17 ENCOUNTER — Telehealth (HOSPITAL_COMMUNITY): Payer: Self-pay | Admitting: *Deleted

## 2024-02-17 NOTE — Telephone Encounter (Signed)
 02/17/2024  Name: Jade Walters MRN: [id number] DOB: 04-13-1985  Reason for Call:  Transition of Care Hospital Discharge Call  Contact Status: Patient Contact Status: Message  Language assistant needed:          Follow-Up Questions:    Van Postnatal Depression Scale:  In the Past 7 Days:    PHQ2-9 Depression Scale:     Discharge Follow-up:    Post-discharge interventions: NA  Mliss Sieve, RN 02/17/2024 12:14

## 2024-02-18 ENCOUNTER — Encounter (HOSPITAL_BASED_OUTPATIENT_CLINIC_OR_DEPARTMENT_OTHER): Payer: Self-pay | Admitting: Certified Nurse Midwife

## 2024-03-19 ENCOUNTER — Ambulatory Visit (HOSPITAL_BASED_OUTPATIENT_CLINIC_OR_DEPARTMENT_OTHER): Payer: Self-pay | Admitting: Obstetrics and Gynecology
